# Patient Record
Sex: Female | Born: 1935 | Race: White | Hispanic: No | Smoking: Former smoker
Health system: Southern US, Community
[De-identification: ages and names within clinical notes are randomized; demographics above are authoritative.]

## PROBLEM LIST (undated history)

## (undated) DIAGNOSIS — I495 Sick sinus syndrome: Secondary | ICD-10-CM

## (undated) DIAGNOSIS — K2970 Gastritis, unspecified, without bleeding: Secondary | ICD-10-CM

## (undated) DIAGNOSIS — I2510 Atherosclerotic heart disease of native coronary artery without angina pectoris: Secondary | ICD-10-CM

## (undated) DIAGNOSIS — D649 Anemia, unspecified: Secondary | ICD-10-CM

## (undated) DIAGNOSIS — M1990 Unspecified osteoarthritis, unspecified site: Secondary | ICD-10-CM

## (undated) DIAGNOSIS — I5043 Acute on chronic combined systolic (congestive) and diastolic (congestive) heart failure: Secondary | ICD-10-CM

## (undated) DIAGNOSIS — I639 Cerebral infarction, unspecified: Secondary | ICD-10-CM

## (undated) DIAGNOSIS — I4891 Unspecified atrial fibrillation: Secondary | ICD-10-CM

## (undated) DIAGNOSIS — M4807 Spinal stenosis, lumbosacral region: Secondary | ICD-10-CM

## (undated) DIAGNOSIS — N838 Other noninflammatory disorders of ovary, fallopian tube and broad ligament: Secondary | ICD-10-CM

## (undated) DIAGNOSIS — E538 Deficiency of other specified B group vitamins: Secondary | ICD-10-CM

## (undated) DIAGNOSIS — K279 Peptic ulcer, site unspecified, unspecified as acute or chronic, without hemorrhage or perforation: Secondary | ICD-10-CM

## (undated) DIAGNOSIS — E039 Hypothyroidism, unspecified: Secondary | ICD-10-CM

## (undated) HISTORY — DX: Anemia, unspecified: D64.9

## (undated) HISTORY — PX: ABDOMINAL HYSTERECTOMY: SHX81

## (undated) HISTORY — DX: Cerebral infarction, unspecified: I63.9

## (undated) HISTORY — DX: Unspecified atrial fibrillation: I48.91

## (undated) HISTORY — DX: Other noninflammatory disorders of ovary, fallopian tube and broad ligament: N83.8

## (undated) HISTORY — DX: Gastritis, unspecified, without bleeding: K29.70

## (undated) HISTORY — PX: BACK SURGERY: SHX140

## (undated) HISTORY — DX: Deficiency of other specified B group vitamins: E53.8

## (undated) HISTORY — PX: OTHER SURGICAL HISTORY: SHX169

## (undated) HISTORY — DX: Atherosclerotic heart disease of native coronary artery without angina pectoris: I25.10

## (undated) HISTORY — PX: PACEMAKER INSERTION: SHX728

## (undated) HISTORY — DX: Sick sinus syndrome: I49.5

## (undated) HISTORY — PX: INSERT / REPLACE / REMOVE PACEMAKER: SUR710

## (undated) HISTORY — DX: Spinal stenosis, lumbosacral region: M48.07

## (undated) HISTORY — DX: Hypothyroidism, unspecified: E03.9

## (undated) HISTORY — DX: Peptic ulcer, site unspecified, unspecified as acute or chronic, without hemorrhage or perforation: K27.9

## (undated) HISTORY — DX: Unspecified osteoarthritis, unspecified site: M19.90

---

## 1989-09-06 HISTORY — PX: LUMBAR DISC SURGERY: SHX700

## 1999-04-29 ENCOUNTER — Ambulatory Visit (HOSPITAL_COMMUNITY): Admission: RE | Admit: 1999-04-29 | Discharge: 1999-04-29 | Payer: Self-pay | Admitting: Neurosurgery

## 1999-04-29 ENCOUNTER — Encounter: Payer: Self-pay | Admitting: Neurosurgery

## 1999-06-09 ENCOUNTER — Encounter: Payer: Self-pay | Admitting: Neurosurgery

## 1999-06-09 ENCOUNTER — Ambulatory Visit (HOSPITAL_COMMUNITY): Admission: RE | Admit: 1999-06-09 | Discharge: 1999-06-09 | Payer: Self-pay | Admitting: Neurosurgery

## 1999-06-23 ENCOUNTER — Encounter: Payer: Self-pay | Admitting: Neurosurgery

## 1999-06-23 ENCOUNTER — Ambulatory Visit (HOSPITAL_COMMUNITY): Admission: RE | Admit: 1999-06-23 | Discharge: 1999-06-23 | Payer: Self-pay | Admitting: Neurosurgery

## 1999-11-30 ENCOUNTER — Ambulatory Visit (HOSPITAL_COMMUNITY): Admission: RE | Admit: 1999-11-30 | Discharge: 1999-11-30 | Payer: Self-pay | Admitting: Gastroenterology

## 2000-01-05 ENCOUNTER — Ambulatory Visit (HOSPITAL_COMMUNITY): Admission: RE | Admit: 2000-01-05 | Discharge: 2000-01-05 | Payer: Self-pay | Admitting: Gastroenterology

## 2000-01-05 ENCOUNTER — Encounter (INDEPENDENT_AMBULATORY_CARE_PROVIDER_SITE_OTHER): Payer: Self-pay | Admitting: Specialist

## 2001-02-03 ENCOUNTER — Other Ambulatory Visit: Admission: RE | Admit: 2001-02-03 | Discharge: 2001-02-03 | Payer: Self-pay | Admitting: *Deleted

## 2001-02-06 ENCOUNTER — Other Ambulatory Visit: Admission: RE | Admit: 2001-02-06 | Discharge: 2001-02-06 | Payer: Self-pay | Admitting: *Deleted

## 2001-02-27 ENCOUNTER — Ambulatory Visit (HOSPITAL_COMMUNITY): Admission: RE | Admit: 2001-02-27 | Discharge: 2001-02-27 | Payer: Self-pay | Admitting: *Deleted

## 2001-02-27 ENCOUNTER — Encounter (INDEPENDENT_AMBULATORY_CARE_PROVIDER_SITE_OTHER): Payer: Self-pay

## 2001-05-05 ENCOUNTER — Encounter (HOSPITAL_COMMUNITY): Admission: RE | Admit: 2001-05-05 | Discharge: 2001-06-04 | Payer: Self-pay | Admitting: Oncology

## 2001-05-05 ENCOUNTER — Encounter: Admission: RE | Admit: 2001-05-05 | Discharge: 2001-05-05 | Payer: Self-pay | Admitting: Oncology

## 2001-10-04 ENCOUNTER — Ambulatory Visit (HOSPITAL_COMMUNITY): Admission: RE | Admit: 2001-10-04 | Discharge: 2001-10-04 | Payer: Self-pay | Admitting: Neurosurgery

## 2001-10-04 ENCOUNTER — Encounter: Payer: Self-pay | Admitting: Neurosurgery

## 2001-10-20 ENCOUNTER — Encounter: Admission: RE | Admit: 2001-10-20 | Discharge: 2001-10-20 | Payer: Self-pay | Admitting: Oncology

## 2001-10-31 ENCOUNTER — Encounter: Payer: Self-pay | Admitting: Neurosurgery

## 2001-10-31 ENCOUNTER — Encounter: Admission: RE | Admit: 2001-10-31 | Discharge: 2001-10-31 | Payer: Self-pay | Admitting: Neurosurgery

## 2001-11-14 ENCOUNTER — Encounter: Payer: Self-pay | Admitting: Neurosurgery

## 2001-11-14 ENCOUNTER — Encounter: Admission: RE | Admit: 2001-11-14 | Discharge: 2001-11-14 | Payer: Self-pay | Admitting: Neurosurgery

## 2001-11-30 ENCOUNTER — Encounter: Payer: Self-pay | Admitting: Neurosurgery

## 2001-11-30 ENCOUNTER — Encounter: Admission: RE | Admit: 2001-11-30 | Discharge: 2001-11-30 | Payer: Self-pay | Admitting: Neurosurgery

## 2002-04-10 ENCOUNTER — Other Ambulatory Visit: Admission: RE | Admit: 2002-04-10 | Discharge: 2002-04-10 | Payer: Self-pay | Admitting: *Deleted

## 2002-04-20 ENCOUNTER — Encounter (HOSPITAL_COMMUNITY): Admission: RE | Admit: 2002-04-20 | Discharge: 2002-05-20 | Payer: Self-pay | Admitting: Oncology

## 2002-04-20 ENCOUNTER — Encounter: Admission: RE | Admit: 2002-04-20 | Discharge: 2002-04-20 | Payer: Self-pay | Admitting: Oncology

## 2002-04-24 ENCOUNTER — Encounter (HOSPITAL_COMMUNITY): Payer: Self-pay | Admitting: Oncology

## 2002-06-06 HISTORY — PX: TOTAL ABDOMINAL HYSTERECTOMY W/ BILATERAL SALPINGOOPHORECTOMY: SHX83

## 2002-06-20 ENCOUNTER — Ambulatory Visit (HOSPITAL_COMMUNITY): Admission: RE | Admit: 2002-06-20 | Discharge: 2002-06-20 | Payer: Self-pay | Admitting: Gastroenterology

## 2002-06-20 ENCOUNTER — Encounter (INDEPENDENT_AMBULATORY_CARE_PROVIDER_SITE_OTHER): Payer: Self-pay | Admitting: Specialist

## 2002-06-20 ENCOUNTER — Encounter (INDEPENDENT_AMBULATORY_CARE_PROVIDER_SITE_OTHER): Payer: Self-pay | Admitting: *Deleted

## 2002-06-20 ENCOUNTER — Inpatient Hospital Stay (HOSPITAL_COMMUNITY): Admission: RE | Admit: 2002-06-20 | Discharge: 2002-06-25 | Payer: Self-pay | Admitting: *Deleted

## 2002-07-02 ENCOUNTER — Encounter: Admission: RE | Admit: 2002-07-02 | Discharge: 2002-07-02 | Payer: Self-pay | Admitting: *Deleted

## 2002-10-30 ENCOUNTER — Encounter (HOSPITAL_COMMUNITY): Admission: RE | Admit: 2002-10-30 | Discharge: 2002-11-29 | Payer: Self-pay | Admitting: Oncology

## 2002-10-30 ENCOUNTER — Encounter: Admission: RE | Admit: 2002-10-30 | Discharge: 2002-10-30 | Payer: Self-pay | Admitting: Oncology

## 2003-05-14 ENCOUNTER — Encounter: Admission: RE | Admit: 2003-05-14 | Discharge: 2003-05-14 | Payer: Self-pay | Admitting: Oncology

## 2003-05-14 ENCOUNTER — Encounter (HOSPITAL_COMMUNITY): Admission: RE | Admit: 2003-05-14 | Discharge: 2003-06-06 | Payer: Self-pay | Admitting: Oncology

## 2003-11-11 ENCOUNTER — Encounter (HOSPITAL_COMMUNITY): Admission: RE | Admit: 2003-11-11 | Discharge: 2003-12-11 | Payer: Self-pay | Admitting: Oncology

## 2003-11-11 ENCOUNTER — Encounter: Admission: RE | Admit: 2003-11-11 | Discharge: 2003-11-11 | Payer: Self-pay | Admitting: Oncology

## 2004-07-15 ENCOUNTER — Ambulatory Visit: Payer: Self-pay | Admitting: Cardiology

## 2005-10-29 ENCOUNTER — Inpatient Hospital Stay (HOSPITAL_COMMUNITY): Admission: EM | Admit: 2005-10-29 | Discharge: 2005-11-09 | Payer: Self-pay | Admitting: Emergency Medicine

## 2005-10-29 ENCOUNTER — Ambulatory Visit: Payer: Self-pay | Admitting: Physical Medicine & Rehabilitation

## 2005-10-29 ENCOUNTER — Ambulatory Visit: Payer: Self-pay | Admitting: Cardiology

## 2005-10-31 ENCOUNTER — Ambulatory Visit: Payer: Self-pay | Admitting: Pulmonary Disease

## 2005-11-01 ENCOUNTER — Encounter (INDEPENDENT_AMBULATORY_CARE_PROVIDER_SITE_OTHER): Payer: Self-pay | Admitting: Neurology

## 2005-11-02 ENCOUNTER — Ambulatory Visit: Payer: Self-pay | Admitting: Critical Care Medicine

## 2005-11-04 ENCOUNTER — Encounter: Payer: Self-pay | Admitting: Internal Medicine

## 2005-12-06 ENCOUNTER — Ambulatory Visit: Payer: Self-pay | Admitting: Cardiology

## 2006-02-02 ENCOUNTER — Ambulatory Visit: Payer: Self-pay | Admitting: Internal Medicine

## 2006-08-09 ENCOUNTER — Encounter: Admission: RE | Admit: 2006-08-09 | Discharge: 2006-09-06 | Payer: Self-pay | Admitting: Family Medicine

## 2006-09-07 ENCOUNTER — Encounter: Admission: RE | Admit: 2006-09-07 | Discharge: 2006-10-05 | Payer: Self-pay | Admitting: Family Medicine

## 2006-10-06 ENCOUNTER — Encounter: Admission: RE | Admit: 2006-10-06 | Discharge: 2006-10-20 | Payer: Self-pay | Admitting: Family Medicine

## 2006-10-31 ENCOUNTER — Ambulatory Visit: Payer: Self-pay

## 2006-12-05 ENCOUNTER — Ambulatory Visit: Payer: Self-pay | Admitting: Internal Medicine

## 2006-12-09 ENCOUNTER — Ambulatory Visit: Payer: Self-pay | Admitting: Cardiology

## 2007-01-31 ENCOUNTER — Ambulatory Visit: Payer: Self-pay | Admitting: Internal Medicine

## 2007-03-27 ENCOUNTER — Ambulatory Visit: Payer: Self-pay | Admitting: Internal Medicine

## 2007-03-31 ENCOUNTER — Ambulatory Visit: Payer: Self-pay | Admitting: Cardiology

## 2007-03-31 ENCOUNTER — Inpatient Hospital Stay (HOSPITAL_COMMUNITY): Admission: EM | Admit: 2007-03-31 | Discharge: 2007-04-04 | Payer: Self-pay | Admitting: Emergency Medicine

## 2007-05-22 ENCOUNTER — Ambulatory Visit: Payer: Self-pay | Admitting: Internal Medicine

## 2007-07-17 ENCOUNTER — Ambulatory Visit: Payer: Self-pay | Admitting: Internal Medicine

## 2007-08-18 ENCOUNTER — Encounter: Admission: RE | Admit: 2007-08-18 | Discharge: 2007-08-18 | Payer: Self-pay | Admitting: Neurology

## 2007-09-11 ENCOUNTER — Ambulatory Visit: Payer: Self-pay | Admitting: Internal Medicine

## 2007-10-09 ENCOUNTER — Ambulatory Visit: Payer: Self-pay | Admitting: Internal Medicine

## 2007-12-06 ENCOUNTER — Ambulatory Visit: Payer: Self-pay | Admitting: Cardiology

## 2007-12-07 ENCOUNTER — Ambulatory Visit: Payer: Self-pay | Admitting: Internal Medicine

## 2007-12-07 ENCOUNTER — Observation Stay (HOSPITAL_COMMUNITY): Admission: EM | Admit: 2007-12-07 | Discharge: 2007-12-08 | Payer: Self-pay | Admitting: Emergency Medicine

## 2007-12-12 ENCOUNTER — Ambulatory Visit: Payer: Self-pay

## 2008-01-01 ENCOUNTER — Ambulatory Visit: Payer: Self-pay | Admitting: Cardiology

## 2008-03-11 ENCOUNTER — Ambulatory Visit: Payer: Self-pay | Admitting: Internal Medicine

## 2008-04-08 ENCOUNTER — Ambulatory Visit: Payer: Self-pay

## 2008-07-15 ENCOUNTER — Ambulatory Visit: Payer: Self-pay | Admitting: Internal Medicine

## 2008-10-14 ENCOUNTER — Ambulatory Visit: Payer: Self-pay | Admitting: Internal Medicine

## 2008-11-01 ENCOUNTER — Encounter: Payer: Self-pay | Admitting: Internal Medicine

## 2008-11-05 ENCOUNTER — Ambulatory Visit: Payer: Self-pay | Admitting: Internal Medicine

## 2009-02-08 DIAGNOSIS — I6350 Cerebral infarction due to unspecified occlusion or stenosis of unspecified cerebral artery: Secondary | ICD-10-CM | POA: Insufficient documentation

## 2009-02-08 DIAGNOSIS — E039 Hypothyroidism, unspecified: Secondary | ICD-10-CM | POA: Insufficient documentation

## 2009-02-08 DIAGNOSIS — Z8669 Personal history of other diseases of the nervous system and sense organs: Secondary | ICD-10-CM | POA: Insufficient documentation

## 2009-02-08 DIAGNOSIS — I4891 Unspecified atrial fibrillation: Secondary | ICD-10-CM | POA: Insufficient documentation

## 2009-02-08 DIAGNOSIS — M1990 Unspecified osteoarthritis, unspecified site: Secondary | ICD-10-CM | POA: Insufficient documentation

## 2009-02-08 DIAGNOSIS — I495 Sick sinus syndrome: Secondary | ICD-10-CM | POA: Insufficient documentation

## 2009-02-08 DIAGNOSIS — R55 Syncope and collapse: Secondary | ICD-10-CM

## 2009-02-08 DIAGNOSIS — Z8719 Personal history of other diseases of the digestive system: Secondary | ICD-10-CM

## 2009-02-08 DIAGNOSIS — D518 Other vitamin B12 deficiency anemias: Secondary | ICD-10-CM

## 2009-02-08 DIAGNOSIS — Z87898 Personal history of other specified conditions: Secondary | ICD-10-CM

## 2009-02-08 DIAGNOSIS — I10 Essential (primary) hypertension: Secondary | ICD-10-CM | POA: Insufficient documentation

## 2009-02-08 DIAGNOSIS — G9001 Carotid sinus syncope: Secondary | ICD-10-CM | POA: Insufficient documentation

## 2009-02-08 DIAGNOSIS — I447 Left bundle-branch block, unspecified: Secondary | ICD-10-CM

## 2009-02-14 ENCOUNTER — Ambulatory Visit: Payer: Self-pay | Admitting: Cardiology

## 2009-03-29 ENCOUNTER — Inpatient Hospital Stay (HOSPITAL_COMMUNITY): Admission: EM | Admit: 2009-03-29 | Discharge: 2009-03-31 | Payer: Self-pay | Admitting: Emergency Medicine

## 2009-03-29 ENCOUNTER — Ambulatory Visit: Payer: Self-pay | Admitting: Internal Medicine

## 2009-04-09 ENCOUNTER — Encounter: Payer: Self-pay | Admitting: Internal Medicine

## 2009-04-09 ENCOUNTER — Ambulatory Visit: Payer: Self-pay | Admitting: Cardiology

## 2009-07-09 ENCOUNTER — Ambulatory Visit: Payer: Self-pay | Admitting: Internal Medicine

## 2009-08-19 ENCOUNTER — Ambulatory Visit: Payer: Self-pay | Admitting: Internal Medicine

## 2009-08-19 DIAGNOSIS — R079 Chest pain, unspecified: Secondary | ICD-10-CM

## 2009-08-19 DIAGNOSIS — I4891 Unspecified atrial fibrillation: Secondary | ICD-10-CM

## 2009-09-08 ENCOUNTER — Encounter: Payer: Self-pay | Admitting: Cardiology

## 2009-10-08 ENCOUNTER — Ambulatory Visit: Payer: Self-pay | Admitting: Internal Medicine

## 2010-01-05 ENCOUNTER — Encounter: Payer: Self-pay | Admitting: Cardiology

## 2010-02-12 ENCOUNTER — Encounter: Payer: Self-pay | Admitting: Internal Medicine

## 2010-02-12 ENCOUNTER — Ambulatory Visit: Payer: Self-pay

## 2010-04-02 ENCOUNTER — Ambulatory Visit: Payer: Self-pay | Admitting: Cardiology

## 2010-04-09 ENCOUNTER — Encounter: Payer: Self-pay | Admitting: Internal Medicine

## 2010-04-17 ENCOUNTER — Ambulatory Visit: Payer: Self-pay | Admitting: Internal Medicine

## 2010-05-13 ENCOUNTER — Encounter
Admission: RE | Admit: 2010-05-13 | Discharge: 2010-08-11 | Payer: Self-pay | Source: Home / Self Care | Admitting: Family Medicine

## 2010-07-18 ENCOUNTER — Ambulatory Visit: Payer: Self-pay | Admitting: Internal Medicine

## 2010-09-01 ENCOUNTER — Ambulatory Visit: Payer: Self-pay | Admitting: Internal Medicine

## 2010-10-08 NOTE — Procedures (Signed)
Summary: PACER CK GUIDANT   Allergies: 1)  ! Benadryl   PPM Specifications Following MD:  Sherryl Manges, MD     PPM Vendor:  [facility] [employer]     PPM Model Number:  [id number]     PPM Serial Number:  [id number] DOI:  11/01/2005     PPM Implanting MD:  Sherryl Manges, MD  Lead 1    Location: RA     DOI: 11/01/2005     Model #: [id number]     Serial #: [id number]     Status: active Lead 2    Location: RV     DOI: 11/01/2005     Model #: 5784     Serial #: [id number]     Status: active  Magnet Response Rate:  BOL 100 ERI  85  Indications:  TTM'S with Mednet   PPM Follow Up Battery Voltage:  good V     Battery Est. Longevity:  >5.53yrs     Pacer Dependent:  Yes       PPM Device Measurements Atrium  Impedance: 480 ohms, Threshold: 0.5 V at 0.4 msec Right Ventricle  Amplitude: 6.0 mV, Impedance: 550 ohms, Threshold: 0.5 V at 0.40 msec  Episodes MS Episodes:  3.2K     Percent Mode Switch:  1%     Coumadin:  Yes Ventricular High Rate:  0     Atrial Pacing:  85%     Ventricular Pacing:  70%  Parameters Mode:  DDDR     Lower Rate Limit:  60     Upper Rate Limit:  110 Paced AV Delay:  300     Sensed AV Delay:  260 Next Cardiology Appt Due:  08/06/2010 Tech Comments:  NORMAL DEVICE FUNCTION. NO PWAVES ON CHECK.  NO CHANGES MADE.  ROV IN 6 MTHS W/SK. Vella Kohler  February 12, 2010 3:42 PM

## 2010-10-08 NOTE — Assessment & Plan Note (Signed)
Summary: F1Y/ANAS   Visit Type:  1 yr f/u Primary Provider:  [name] MD  CC:  chest pain...sob...edema/legs/ankles.  History of Present Illness: Ms Thissen returns today for evaluation management of her history of atrial fibrillation, left bundle branch block, carotid sinus sensitivity, history of syncope, atypical chest pain, and permanent pacemaker.  She still gets occasional chest pain shortness of breath particularly at night. She takes several nitroglycerin for that.  She still lives alone. She has not fallen. She denies any bleeding or melena.  She really denies any palpitations or rapid heartbeats. She's had orthopnea PND or edema.  Current Medications (verified): 1)  Metoprolol Tartrate 50 Mg Tabs (Metoprolol Tartrate) .Marland Kitchen.. 1 Tab Two Times A Day 2)  Diltiazem Hcl Er Beads 360 Mg Xr24h-Cap (Diltiazem Hcl Er Beads) .Marland Kitchen.. 1 Cap Once Daily 3)  Coumadin 3 Mg Tabs (Warfarin Sodium) .... As Directed By Cvrr 4)  Levoxyl 100 Mcg Tabs (Levothyroxine Sodium) .Marland Kitchen.. 1 Tab Once Daily 5)  Lasix 20 Mg Tabs (Furosemide) .... As Needed 6)  Antivert 25 Mg Tabs (Meclizine Hcl) .... 1/2 Tab Once Daily 7)  Levoxyl 25 Mcg Tabs (Levothyroxine Sodium) .Marland Kitchen.. 1 Tab On Sat and Sun 8)  Vitamin D 1000 Unit Caps (Cholecalciferol) .Marland Kitchen.. 1 Cap Once Daily 9)  Ultram 50 Mg Tabs (Tramadol Hcl) .Marland Kitchen.. 1 Tab Four Times Daily As Needed 10)  Nitroglycerin 0.4 Mg Subl (Nitroglycerin) .... One Tablet Under Tongue Every 5 Minutes As Needed For Chest Pain---May Repeat Times Three 11)  Promethazine Hcl 25 Mg Tabs (Promethazine Hcl) .... As Needed 12)  Tylenol Extra Strength 500 Mg Tabs (Acetaminophen) .... As Needed 13)  Vitamin B-12 Injection .Marland Kitchen.. 1 Injection Every Month 14)  Isosorbide Dinitrate 10 Mg Tabs (Isosorbide Dinitrate) .Marland Kitchen.. 1 Tab Two Times A Day  Allergies: 1)  ! Benadryl  Past History:  Past Medical History: Last updated: 02/08/2009 PAROXYSMAL ATRIAL FIBRILLATION (ICD-427.31) LEFT BUNDLE BRANCH BLOCK  (ICD-426.3) ANGINA, UNSTABLE (ICD-411.1) HYPERTENSION, UNSPECIFIED (ICD-401.9) PACEMAKER, PERMANENT (ICD-V45.01) CAROTID SINUS SYNDROME (ICD-337.01) CVA (ICD-434.91) HYPOTHYROIDISM (ICD-244.9) SYNCOPE (ICD-780.2) MIGRAINES, HX OF (ICD-V13.8) ANEMIA, B12 DEFICIENCY (ICD-281.1) OSTEOARTHRITIS (ICD-715.90) CATARACT, HX OF (ICD-V12.40) GASTRITIS, HX OF (ICD-V12.79)    Past Surgical History: Last updated: 02/08/2009 Status post Guidant dual lead permanent pacemaker in 2007 secondary to carotid sinus hypersensitivity (pneumothorax after pacemaker).  Colonoscopy with biopsy...06/20/2002 Exploratory laparotomy, lysis of adhesions..06/20/2002 fractional D&C, hysteroscopy withresection of endometrial polyps... 02/27/01       Family History: Last updated: 02/08/2009 Family History of Coronary Artery Disease: Mother deceased at 31, Father deceased at 16 and 1 sister w/heart dz   Social History: Last updated: 02/08/2009 Single  Tobacco Use - Former.  Alcohol Use - no Drug Use - no  Risk Factors: Smoking Status: quit (02/08/2009)  Review of Systems       negative as in history of present illness  Vital Signs:  Patient profile:   77 year old female Height:      67 inches Weight:      140 pounds BMI:     22.01 Pulse rate:   63 / minute Pulse rhythm:   regular BP sitting:   122 / 60  (left arm) Cuff size:   large  Vitals Entered By: Danielle Rankin, CMA (April 02, 2010 4:35 PM)  Physical Exam  General:  elderly, alert oriented Head:  normocephalic and atraumatic Eyes:  glasses, otherwise normal Neck:  Neck supple, no JVD. No masses, thyromegaly or abnormal cervical nodes. Chest Deyanna Mctier:  no deformities  or breast masses noted Lungs:  Clear bilaterally to auscultation and percussion. Heart:  PMI nondisplaced, regular rate and rhythm, widely split S2, no gallop Msk:  decreased ROM.  decreased ROM.   Pulses:  pulses normal in all 4 extremities Extremities:  trace left pedal edema  and trace right pedal edema.  trace left pedal edema.   Neurologic:  Alert and oriented x 3. Skin:  Intact without lesions or rashes. Psych:  Normal affect.   EKG  Procedure date:  04/02/2010  Findings:      AV sequential pacing  PPM Specifications Following MD:  Sherryl Manges, MD     [facility] Vendor:  [facility] [employer]     PPM Model Number:  [id number]     PPM Serial Number:  [id number] DOI:  11/01/2005     PPM Implanting MD:  Sherryl Manges, MD  Lead 1    Location: RA     DOI: 11/01/2005     Model #: 1478     Serial #: [id number]     Status: active Lead 2    Location: RV     DOI: 11/01/2005     Model #: 5784     Serial #: [id number]     Status: active  Magnet Response Rate:  BOL 100 ERI  85  Indications:  TTM'S with Mednet   PPM Follow Up Pacer Dependent:  Yes      Episodes Coumadin:  Yes  Parameters Mode:  DDDR     Lower Rate Limit:  60     Upper Rate Limit:  110 Paced AV Delay:  300     Sensed AV Delay:  260  Impression & Recommendations:  Problem # 1:  PAROXYSMAL ATRIAL FIBRILLATION (ICD-427.31)  Her updated medication list for this problem includes:    Metoprolol Tartrate 50 Mg Tabs (Metoprolol tartrate) .Marland Kitchen... 1 tab two times a day    Coumadin 3 Mg Tabs (Warfarin sodium) .Marland Kitchen... As directed by cvrr  Orders: EKG w/ Interpretation (93000)  Problem # 2:  LEFT BUNDLE BRANCH BLOCK (ICD-426.3) Assessment: Unchanged  Her updated medication list for this problem includes:    Metoprolol Tartrate 50 Mg Tabs (Metoprolol tartrate) .Marland Kitchen... 1 tab two times a day    Diltiazem Hcl Er Beads 360 Mg Xr24h-cap (Diltiazem hcl er beads) .Marland Kitchen... 1 cap once daily    Coumadin 3 Mg Tabs (Warfarin sodium) .Marland Kitchen... As directed by cvrr    Nitroglycerin 0.4 Mg Subl (Nitroglycerin) ..... One tablet under tongue every 5 minutes as needed for chest pain---may repeat times three    Isosorbide Dinitrate 10 Mg Tabs (Isosorbide dinitrate) .Marland Kitchen... 1 tab two times a day  Problem # 3:  CHEST PAIN, ATYPICAL  (ICD-786.59) Assessment: Unchanged  Her updated medication list for this problem includes:    Metoprolol Tartrate 50 Mg Tabs (Metoprolol tartrate) .Marland Kitchen... 1 tab two times a day    Diltiazem Hcl Er Beads 360 Mg Xr24h-cap (Diltiazem hcl er beads) .Marland Kitchen... 1 cap once daily    Coumadin 3 Mg Tabs (Warfarin sodium) .Marland Kitchen... As directed by cvrr    Nitroglycerin 0.4 Mg Subl (Nitroglycerin) ..... One tablet under tongue every 5 minutes as needed for chest pain---may repeat times three    Isosorbide Dinitrate 10 Mg Tabs (Isosorbide dinitrate) .Marland Kitchen... 1 tab two times a day  Problem # 4:  HYPERTENSION, UNSPECIFIED (ICD-401.9) Assessment: Improved  Her updated medication list for this problem includes:    Metoprolol Tartrate 50 Mg Tabs (Metoprolol tartrate) .Marland KitchenMarland KitchenMarland KitchenMarland Kitchen 1  tab two times a day    Diltiazem Hcl Er Beads 360 Mg Xr24h-cap (Diltiazem hcl er beads) .Marland Kitchen... 1 cap once daily    Lasix 20 Mg Tabs (Furosemide) .Marland Kitchen... As needed  Problem # 5:  PACEMAKER, PERMANENT (ICD-V45.01) Assessment: Unchanged  Problem # 6:  CAROTID SINUS SYNDROME (ICD-337.01) Assessment: Improved  Her updated medication list for this problem includes:    Metoprolol Tartrate 50 Mg Tabs (Metoprolol tartrate) .Marland Kitchen... 1 tab two times a day    Diltiazem Hcl Er Beads 360 Mg Xr24h-cap (Diltiazem hcl er beads) .Marland Kitchen... 1 cap once daily    Coumadin 3 Mg Tabs (Warfarin sodium) .Marland Kitchen... As directed by cvrr    Nitroglycerin 0.4 Mg Subl (Nitroglycerin) ..... One tablet under tongue every 5 minutes as needed for chest pain---may repeat times three    Isosorbide Dinitrate 10 Mg Tabs (Isosorbide dinitrate) .Marland Kitchen... 1 tab two times a day  Problem # 7:  CVA (ICD-434.91)  Her updated medication list for this problem includes:    Coumadin 3 Mg Tabs (Warfarin sodium) .Marland Kitchen... As directed by cvrr  Problem # 8:  COUMADIN THERAPY (ICD-V58.61) Assessment: Unchanged  Patient Instructions: 1)  Your physician recommends that you schedule a follow-up appointment in: YEAR   WITH  DR Alexzander Dolinger 2)  Your physician recommends that you continue on your current medications as directed. Please refer to the Current Medication list given to you today.

## 2010-10-08 NOTE — Cardiovascular Report (Signed)
Summary: Office Visit   Office Visit   Imported By: Roderic Ovens 09/02/2010 08:59:11  _____________________________________________________________________  External Attachment:    Type:   Image     Comment:   External Document

## 2010-10-08 NOTE — Assessment & Plan Note (Signed)
Summary: DEVICE/SAF   Visit Type:  PPM-[employer] Primary [name]:  [name] MD  CC:  sob and leg swelling.  History of Present Illness:   [name] returns in followup for paroxysmal atrial fibrillation, syncope secondary to carotid sinus hypersensitivity previously implanted pacemaker.  She really denies any palpitations or rapid heartbeats. she has had no edema or chest pain  Problems Prior to Update: 1)  Coumadin Therapy  (ICD-V58.61) 2)  Postural Lightheadedness  (ICD-780.4) 3)  Paroxysmal Atrial Fibrillation  (ICD-427.31) 4)  Left Bundle Branch Block  (ICD-426.3) 5)  Chest Pain, Atypical  (ICD-786.59) 6)  Hypertension, Unspecified  (ICD-401.9) 7)  Pacemaker, Permanent  (ICD-V45.01) 8)  Carotid Sinus Syndrome  (ICD-337.01) 9)  Cva  (ICD-434.91) 10)  Hypothyroidism  (ICD-244.9) 11)  Syncope  (ICD-780.2) 12)  Migraines, Hx of  (ICD-V13.8) 13)  Anemia, B12 Deficiency  (ICD-281.1) 14)  Osteoarthritis  (ICD-715.90) 15)  Cataract, Hx of  (ICD-V12.40) 16)  Gastritis, Hx of  (ICD-V12.79)  Current Medications (verified): 1)  Metoprolol Tartrate 50 Mg Tabs (Metoprolol Tartrate) .Marland Kitchen.. 1 Tab Two Times A Day 2)  Diltiazem Hcl Er Beads 360 Mg Xr24h-Cap (Diltiazem Hcl Er Beads) .Marland Kitchen.. 1 Cap Once Daily 3)  Coumadin 3 Mg Tabs (Warfarin Sodium) .... As Directed By Cvrr 4)  Levoxyl 100 Mcg Tabs (Levothyroxine Sodium) .Marland Kitchen.. 1 Tab Once Daily 5)  Lasix 20 Mg Tabs (Furosemide) .... As Needed 6)  Antivert 25 Mg Tabs (Meclizine Hcl) .... 1/2 Tab Once Daily 7)  Levoxyl 25 Mcg Tabs (Levothyroxine Sodium) .Marland Kitchen.. 1 Tab On Sat and Sun 8)  Vitamin D 1000 Unit Tabs (Cholecalciferol) .... Two Times A Day 9)  Ultram 50 Mg Tabs (Tramadol Hcl) .Marland Kitchen.. 1 Tab Four Times Daily As Needed 10)  Nitroglycerin 0.4 Mg Subl (Nitroglycerin) .... One Tablet Under Tongue Every 5 Minutes As Needed For Chest Pain---May Repeat Times Three 11)  Promethazine Hcl 25 Mg Tabs (Promethazine Hcl) .... As Needed 12)   Tylenol Extra Strength 500 Mg Tabs (Acetaminophen) .... As Needed 13)  Vitamin B-12 Injection .Marland Kitchen.. 1 Injection Every Month 14)  Isosorbide Dinitrate 10 Mg Tabs (Isosorbide Dinitrate) .Marland Kitchen.. 1 Tab Two Times A Day  Allergies (verified): 1)  ! Benadryl  Past History:  Past Medical History: Last updated: 02/08/2009 PAROXYSMAL ATRIAL FIBRILLATION (ICD-427.31) LEFT BUNDLE BRANCH BLOCK (ICD-426.3) ANGINA, UNSTABLE (ICD-411.1) HYPERTENSION, UNSPECIFIED (ICD-401.9) PACEMAKER, PERMANENT (ICD-V45.01) CAROTID SINUS SYNDROME (ICD-337.01) CVA (ICD-434.91) HYPOTHYROIDISM (ICD-244.9) SYNCOPE (ICD-780.2) MIGRAINES, HX OF (ICD-V13.8) ANEMIA, B12 DEFICIENCY (ICD-281.1) OSTEOARTHRITIS (ICD-715.90) CATARACT, HX OF (ICD-V12.40) GASTRITIS, HX OF (ICD-V12.79)    Past Surgical History: Last updated: 02/08/2009 Status post Guidant dual lead permanent pacemaker in 2007 secondary to carotid sinus hypersensitivity (pneumothorax after pacemaker).  Colonoscopy with biopsy...06/20/2002 Exploratory laparotomy, lysis of adhesions..06/20/2002 fractional D&C, hysteroscopy withresection of endometrial polyps... 02/27/01       Family History: Last updated: 02/08/2009 Family History of Coronary Artery Disease: Mother deceased at 29, Father deceased at 73 and 1 sister w/heart dz   Social History: Last updated: 02/08/2009 Single  Tobacco Use - Former.  Alcohol Use - no Drug Use - no  Risk Factors: Smoking Status: quit (02/08/2009)  Vital Signs:  Patient profile:   78 year old female Height:      67 inches Weight:      138.50 pounds BMI:     21.77 Pulse rate:   64 / minute BP sitting:   118 / 72  (right arm) Cuff size:   regular  Vitals Entered By:  Caralee Ates CMA (September 01, 2010 3:23 PM)  Physical Exam  General:  The patient was alert and oriented in no acute distress. HEENT Normal.  Neck veins were flat, carotids were brisk.  Lungs were clear.  Heart sounds were regular without murmurs or  gallops.  Abdomen was soft with active bowel sounds. There is no clubbing cyanosis or edema. Skin Warm and dry    PPM Specifications Following MD:  Sherryl Manges, MD     [facility] Vendor:  [facility] [employer]     PPM Model Number:  [id number]     PPM Serial Number:  [id number] DOI:  11/01/2005     PPM Implanting MD:  Sherryl Manges, MD  Lead 1    Location: RA     DOI: 11/01/2005     Model #: 0981     Serial #: [id number]     Status: active Lead 2    Location: RV     DOI: 11/01/2005     Model #: 2130     Serial #: [id number]     Status: active  Magnet Response Rate:  BOL 100 ERI  85  Indications:  TTM'S with Mednet   PPM Follow Up Remote Check?  No Battery Voltage:  good  V     Battery Est. Longevity:  5 years     Pacer Dependent:  Yes       PPM Device Measurements Atrium  Amplitude: 1.8 mV, Impedance: 460 ohms, Threshold: 0.5 V at 0.4 msec Right Ventricle  Amplitude: 4.4 mV, Impedance: 560 ohms, Threshold: 0.8 V at 0.4 msec  Episodes MS Episodes:  0     Coumadin:  Yes Atrial Pacing:  81%     Ventricular Pacing:  65%  Parameters Mode:  DDDR     Lower Rate Limit:  60     Upper Rate Limit:  110 Paced AV Delay:  300     Sensed AV Delay:  260 Next Cardiology Appt Due:  02/05/2011 Tech Comments:  No parameter changes.   Device function normal.  27.2K/ PVC's and 19.8K/PAC's.  TTM's with Mednet.  ROV 6 months clinic. Altha Harm, LPN  September 01, 2010 3:38 PM    Impression & Recommendations:  Problem # 1:  PAROXYSMAL ATRIAL FIBRILLATION (ICD-427.31)  noen detected on her pacer Her updandeted medication list for this problem includes:    Metoprolol Tartrate 50 Mg Tabs (Metoprolol tartrate) .Marland Kitchen... 1 tab two times a day    Coumadin 3 Mg Tabs (Warfarin sodium) .Marland Kitchen... As directed by cvrr  Her updated medication list for this problem includes:    Metoprolol Tartrate 50 Mg Tabs (Metoprolol tartrate) .Marland Kitchen... 1 tab two times a day    Coumadin 3 Mg Tabs (Warfarin sodium) .Marland Kitchen... As directed by  cvrr  Problem # 2:  PACEMAKER, PERMANENT (ICD-V45.01) Device parameters and data were reviewed and no changes were made  Problem # 3:  CAROTID SINUS SYNDROME (ICD-337.01)  intermittent atrial and ventriuclar pacing  Her updated medication list for this problem includes:    Metoprolol Tartrate 50 Mg Tabs (Metoprolol tartrate) .Marland Kitchen... 1 tab two times a day    Diltiazem Hcl Er Beads 360 Mg Xr24h-cap (Diltiazem hcl er beads) .Marland Kitchen... 1 cap once daily    Coumadin 3 Mg Tabs (Warfarin sodium) .Marland Kitchen... As directed by cvrr    Nitroglycerin 0.4 Mg Subl (Nitroglycerin) ..... One tablet under tongue every 5 minutes as needed for chest pain---may repeat times three    Isosorbide Dinitrate 10  Mg Tabs (Isosorbide dinitrate) .Marland Kitchen... 1 tab two times a day  Patient Instructions: 1)  Your physician recommends that you continue on your current medications as directed. Please refer to the Current Medication list given to you today. 2)  Your physician wants you to follow-up in: 6 months with pacer clinic in [place] and 12 months with Dr. Johney Frame in [place].   You will receive a reminder letter in the mail two months in advance. If you don't receive a letter, please call our office to schedule the follow-up appointment.

## 2010-10-08 NOTE — Cardiovascular Report (Signed)
Summary: Office Visit   Office Visit   Imported By: Roderic Ovens 02/27/2010 09:50:25  _____________________________________________________________________  External Attachment:    Type:   Image     Comment:   External Document

## 2010-10-08 NOTE — Cardiovascular Report (Signed)
Summary: TTM   TTM   Imported By: Roderic Ovens 10/27/2009 15:52:40  _____________________________________________________________________  External Attachment:    Type:   Image     Comment:   External Document

## 2010-10-19 ENCOUNTER — Encounter: Payer: Self-pay | Admitting: Internal Medicine

## 2010-10-19 DIAGNOSIS — I495 Sick sinus syndrome: Secondary | ICD-10-CM

## 2010-11-12 NOTE — Cardiovascular Report (Signed)
Summary: TTM   TTM   Imported By: Roderic Ovens 11/04/2010 14:41:17  _____________________________________________________________________  External Attachment:    Type:   Image     Comment:   External Document

## 2010-12-13 LAB — GLUCOSE, CAPILLARY: Glucose-Capillary: 128 mg/dL — ABNORMAL HIGH (ref 70–99)

## 2010-12-13 LAB — CARDIAC PANEL(CRET KIN+CKTOT+MB+TROPI)
CK, MB: 5.8 ng/mL — ABNORMAL HIGH (ref 0.3–4.0)
CK, MB: 6.1 ng/mL — ABNORMAL HIGH (ref 0.3–4.0)
Relative Index: 3.3 — ABNORMAL HIGH (ref 0.0–2.5)
Relative Index: 4.1 — ABNORMAL HIGH (ref 0.0–2.5)
Total CK: 143 U/L (ref 7–177)
Total CK: 185 U/L — ABNORMAL HIGH (ref 7–177)
Troponin I: 0.02 ng/mL (ref 0.00–0.06)
Troponin I: 0.02 ng/mL (ref 0.00–0.06)

## 2010-12-13 LAB — CK TOTAL AND CKMB (NOT AT ARMC)
CK, MB: 7.1 ng/mL — ABNORMAL HIGH (ref 0.3–4.0)
Relative Index: 3 — ABNORMAL HIGH (ref 0.0–2.5)
Total CK: 240 U/L — ABNORMAL HIGH (ref 7–177)

## 2010-12-13 LAB — PROTIME-INR
INR: 1.9 — ABNORMAL HIGH (ref 0.00–1.49)
INR: 2 — ABNORMAL HIGH (ref 0.00–1.49)
Prothrombin Time: 22.9 seconds — ABNORMAL HIGH (ref 11.6–15.2)
Prothrombin Time: 23.5 seconds — ABNORMAL HIGH (ref 11.6–15.2)

## 2010-12-13 LAB — DIFFERENTIAL
Basophils Relative: 0 % (ref 0–1)
Eosinophils Absolute: 0 10*3/uL (ref 0.0–0.7)
Eosinophils Relative: 0 % (ref 0–5)
Lymphocytes Relative: 9 % — ABNORMAL LOW (ref 12–46)
Lymphs Abs: 0.9 10*3/uL (ref 0.7–4.0)
Neutrophils Relative %: 87 % — ABNORMAL HIGH (ref 43–77)

## 2010-12-13 LAB — BASIC METABOLIC PANEL
Chloride: 104 mEq/L (ref 96–112)
GFR calc Af Amer: >60 mL/min (ref >60)
Potassium: 3.8 mEq/L (ref 3.5–5.1)
Sodium: 139 mEq/L (ref 135–145)

## 2010-12-13 LAB — POCT CARDIAC MARKERS
CKMB, poc: 2.6 ng/mL (ref 1.0–8.0)
CKMB, poc: 3.2 ng/mL (ref 1.0–8.0)
Troponin i, poc: <0.05 ng/mL (ref 0.00–0.09)
Troponin i, poc: <0.05 ng/mL (ref 0.00–0.09)

## 2010-12-13 LAB — CBC
HCT: 32.5 % — ABNORMAL LOW (ref 36.0–46.0)
Hemoglobin: 10.7 g/dL — ABNORMAL LOW (ref 12.0–15.0)
MCHC: 32.8 g/dL (ref 30.0–36.0)
MCV: 82.5 fL (ref 78.0–100.0)
RBC: 3.94 MIL/uL (ref 3.87–5.11)
RDW: 14.8 % (ref 11.5–15.5)
WBC: 9.6 10*3/uL (ref 4.0–10.5)

## 2010-12-13 LAB — TSH: TSH: 1.075 u[IU]/mL (ref 0.350–4.500)

## 2010-12-13 LAB — APTT: aPTT: 35 seconds (ref 24–37)

## 2011-01-18 ENCOUNTER — Encounter: Payer: Self-pay | Admitting: Internal Medicine

## 2011-01-18 DIAGNOSIS — I495 Sick sinus syndrome: Secondary | ICD-10-CM

## 2011-01-19 NOTE — H&P (Signed)
NAME:  [name], [name] NO.:  [id number]   MEDICAL RECORD NO.:  [id number]          PATIENT TYPE:  EMS   LOCATION:  [facility]                         FACILITY:  [facility]   PHYSICIAN:  Bevelyn Buckles. Bensimhon, MDDATE OF BIRTH:  10/04/1917   DATE OF ADMISSION:  12/07/2007  DATE OF DISCHARGE:                              HISTORY & PHYSICAL   PRIMARY CARE PHYSICIAN:  Dr. Christell Constant of [facility].   PRIMARY CARDIOLOGIST:  Jesse Sans. Wall, MD, [facility]   CHIEF COMPLAINT:  Chest pain.   HISTORY OF PRESENT ILLNESS:  [name] is an 77 year old female with a  history of nonobstructive coronary artery disease by remote cath and  pacemaker secondary to carotid sinus hypersensitivity.  Today she was in  her usual state of health but had onset of an elevated heart rate and  palpitations with chest pain.  The chest pain was in the upper part of  her chest and radiated up into her neck.  Her heart rate was  approximately 118 when she took it by her home blood pressure machine.  Her symptoms began about 12:30.  The pain reached an 8/10.  She had some  shortness of breath but no nausea, vomiting or diaphoresis.  She had  sublingual nitroglycerin at home and took 2 of them with little relief.  She went to see her primary MD and there received a third sublingual  nitroglycerin.  Her symptoms began easing off by about 2 p.m. and had  resolved by 3 p.m.  The symptoms started while she was at lunch.  She  has had similar symptoms before and gets these 4-5 times a year.  They  are not associated with exertion but she does not exert herself much.  She also gets episodic shortness of breath and occasional palpitations  but not necessarily all at the same time.  She cannot say how long the  palpitations will last or how often they occur.  The shortness of breath  occasionally occurs at rest but she also has consistent dyspnea on  exertion with exertion such as walking from one end of  the house to the  other.  Currently she is resting comfortably.   PAST MEDICAL HISTORY:  1. Status post cardiac catheterization in 1992 with LAD 30-50% and a      septal perforator 70%.  2. Status post echocardiogram in July of 2008 showing an EF of 50-55%.  3. CVA in 2007.  4. Status post Guidant dual lead permanent pacemaker in 2007 secondary      to carotid sinus hypersensitivity (pneumothorax after pacemaker).  5. Hypertension.  6. Family history of coronary artery disease.  7. Hypothyroidism.  8. History of underlying left bundle branch block.  9. History of gastritis.  10.History of cataracts.  11.Osteoarthritis.  12.Anemia with B12 deficiency.  13.History of migraine.  14.History of lumbar disk problems.  15.History of paroxysmal atrial fibrillation.   ALLERGIES:  She is allergic or intolerant to ACTONEL, CODEINE, TYLENOL,  ULTRACET.   CURRENT MEDICATIONS:  1. Tramadol 50 mg b.i.d. p.r.n.  2. Diltiazem  ER 120 mg b.i.d.  3. Levoxyl 100 mcg daily with an additional 25 mcg on Saturday and      Sunday.  4. Metoprolol 50 mg b.i.d.  5. Coumadin 3 mg 2 tablets daily.  6. Antivert 25 mg daily and t.i.d. p.r.n.  7. Lasix 40 mg a day.  8. Reglan 10 mg p.r.n.   SOCIAL HISTORY:  She lives in [place] alone.  She is a housewife.  She  quit tobacco greater than 40 years ago and has never abused alcohol or  drugs.   FAMILY HISTORY:  Her mother died at age 55 and her father died at age  23, both with coronary artery disease.  She also has 1 sister with heart  disease.   REVIEW OF SYSTEMS:  The chest pain, shortness of breath, palpitations  are described above.  She has had no recent illnesses, no fevers,  chills, coughing or wheezing.  She has noted some general malaise  recently and also noted some dizziness which is helped with the  Antivert.  She has chronic arthralgias and pain in her hips and back.  She denies recent reflux symptoms, hematemesis, hemoptysis or melena.   Full 14 point review of systems is otherwise negative.   PHYSICAL EXAM:  VITAL SIGNS:  Temperature is 97.6, blood pressure  174/86, pulse 60, respiratory rate 17, O2 saturation 99% on room air.  GENERAL:  She is a well-developed elderly white female in no acute  distress.  HEENT:  Is normal.  NECK:  There is no lymphadenopathy, thyromegaly, bruit or JVD noted.  CARDIOVASCULAR:  Her heart is moderately irregular in rate and rhythm  with an S1-S2 but no significant murmur, rub or gallop is noted.  Distal  pulses are intact in all 4 extremities and no femoral bruits are  appreciated.  LUNGS:  Are essentially clear to auscultation bilaterally.  SKIN:  No rashes or lesions are noted.  ABDOMEN:  Soft and nontender with active bowel sounds.  EXTREMITIES:  There is no cyanosis, clubbing or edema noted.  MUSCULOSKELETAL:  There is no joint deformity, effusions and no spine or  CVA tenderness is noted.  NEUROLOGICAL:  She is alert and oriented.  Cranial nerves II-XII grossly  intact.   Chest x-ray is borderline cardiac enlargement but no acute disease.   EKG is AV pacing, rate 72.   Laboratory values are pending but point of care markers are negative x2.   IMPRESSION:  Chest pain:  [name] has had atypical chest pain in the  setting of palpitations.  It is doubtful that this is ischemic but we  will admit her overnight and rule out MI (myocardial infarction).  Her  pacemaker can be interrogated in a.m. to see if she is having recurrent  atrial fibrillation or other significant arrhythmias.  If her cardiac  enzymes are negative and interrogating her pacer does not show any  significant arrhythmias she can possibly be discharged home and have an  outpatient Myoview and follow up with Dr. Daleen Squibb.  She will be continued  on her home medications and a TSH as well as other labs are pending at  the time of dictation.      Theodore Demark, PA-C      Bevelyn Buckles. Bensimhon, MD  Electronically  Signed    RB/MEDQ  D:  12/07/2007  T:  12/08/2007  Job:  [id number]   cc:   Ernestina Penna, M.D.

## 2011-01-19 NOTE — Assessment & Plan Note (Signed)
[facility]                            CARDIOLOGY OFFICE NOTE   [name], [name]                       MRN:          [id number]  DATE:12/06/2007                            DOB:          11-Mar-1918    [name] returns today for management of the following issues:  1. History of atrial fibrillation.  2. 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  3. A history of TIA and stroke associated with carotid artery      hypersensitivity with syncope and severe bradycardia, complete      heart block.  4. A history of dual-chamber pacemaker by Dr. Berton Mount.   She had her pacemaker checked in February.  She is doing well.  She is  frustrated she cannot get out and plant her garden and do activities.  She comes with a neighbor and her friend, who says she is doing really  well.  She is still very sharp mentally.  She saw Dr. Vernon Prey  recently, who obtained blood work.  This all looked within normal limits  except a borderline low B12.  She is now getting B12 injections.  Her  thyroid panel was normal, electrolytes were normal, and CBC was normal.   She looks remarkably younger than her stated age.  She is 89.  She has  no other complaints.  She has had no syncope or presyncope.   Her meds are Levoxyl 25 mg every Saturday and Sunday, metoprolol 50 mg  p.o. b.i.d, Dilacor XR 230 mg a day, Coumadin as directed, Lasix 20 mg a  day, Antivert 25 mg a day.   EXAM:  Her blood pressure today is 161/70, pulse 59 and she is regular.  EKG without a magnet shows AV sequential pacing and with the magnet it  shows AV sequential pacing up to a rate of 100 beats a minute.  HEENT:  Normocephalic, atraumatic.  PERRLA.  Extraocular movements are  intact.  Facial symmetry is normal.  Her affect is less depressed than last time.  Carotids are full.  She has a soft right carotid bruit.  Thyroid is not  enlarged.   Trachea is midline.  LUNGS:  Clear.  HEART:  Reveals a nondisplaced PMI.  Normal S1 and S2.  The abdomen is soft.  There is no edema.  Pulses are present.  NEURO:  Exam is intact.   [name] is doing well from our standpoint.  I reinforced that she is  getting around pretty well, looks much younger than stated age, has not  had any more complications.  She seems to have a lot of family and  friends' support.  I will see her back in a year.     Thomas C. Daleen Squibb, MD, [facility]  Electronically Signed    TCW/MedQ  DD: 12/06/2007  DT: 12/06/2007  Job #: [id number]   cc:   Ernestina Penna, M.D.

## 2011-01-19 NOTE — Discharge Summary (Signed)
NAME:  [name], [name] NO.:  [id number]   MEDICAL RECORD NO.:  [id number]          PATIENT TYPE:  OBV   LOCATION:  3735                         FACILITY:  [facility]   PHYSICIAN:  Thomas C. Wall, MD, FACCDATE OF BIRTH:  09/11/1917   DATE OF ADMISSION:  12/07/2007  DATE OF DISCHARGE:  12/08/2007                               DISCHARGE SUMMARY   PRIMARY CARE PHYSICIAN:  Ernestina Penna, MD   DISCHARGE DIAGNOSIS:  Chest pain.   SECONDARY DIAGNOSES:  1. History of nonobstructive coronary artery disease by cardiac      catheterization in 1992.  2. History of normal left ventricular function by echocardiogram, July      2008.  3. History of cerebrovascular accident in 2007.  4. Hypertension.  5. Hypothyroidism.  6. History of left bundle-branch block.  7. History of bradycardia, status post Guidant dual-chamber permanent      pacemaker.  8. History of gastritis.  9. Cataracts.  10.B12 deficiency anemia.  11.Osteoarthritis.  12.History of migraines.  13.History of paroxysmal atrial fibrillation.   ALLERGIES:  1. ACTONEL.  2. CODEINE.  3. TYLENOL.  4. ULTRACET.   PROCEDURES:  Pacemaker interrogation.   HISTORY OF PRESENT ILLNESS:  An 77 year old Caucasian female with  history of nonobstructive CAD and paroxysmal atrial fibrillation.  She  was in her usual state of health on December 07, 2007, when she noted tachy  palpitations and chest discomfort with radiation to her neck associated  with dyspnea and unrelieved by nitroglycerin.  She was seen by her  primary care [name], treated with additional sublingual nitroglycerin  with complete resolution of her symptoms approximately 2-1/2 hours after  they started.  She was subsequently sent to the [facility] ED for  further evaluation.  Upon arrival in the ED, she was AV paced on ECG and  point-of-care markers were negative x2.  She was admitted for further  evaluation and rule out.   HOSPITAL COURSE:  The patient  ruled out for MI and had no additional  palpitations, although she had had one episode of chest discomfort and  that was brief in nature.  Her INR was noted be subtherapeutic at 1.5.  She will have her pacemaker interrogated this afternoon and at this time  that is pending.  We do plan, however, to discharge her home today with  an increase in her diltiazem dose from 120 mg to 240 mg daily.  We have  also arranged for an outpatient adenosine Myoview on December 12, 2007, at  7:45 a.m.  [name] will be discharged home today in good condition.   DISCHARGE LABS:  Sodium 139, potassium 4.0, chloride 102, CO2 30, BUN  17, creatinine 0.81, glucose 98, and INR 1.5, total bilirubin 0.6,  alkaline phosphatase 50, AST 24, ALT 14, and albumin 3.3.  Cardiac  markers negative x2.  Total cholesterol 159, triglycerides 73, HDL 48,  LDL 96, calcium 8.9, and TSH 2.783.   DISPOSITION:  The patient is being discharged home today in good  condition.   FOLLOWUP PLANS AND APPOINTMENTS:  She has an  adenosine Myoview scheduled  for December 12, 2007, at 7:45 a.m.  She will follow up with Dr. Valera Castle  on January 01, 2008, at 2 p.m.  She is to follow up with Dr. Christell Constant for INR  check next week.   DISCHARGE MEDICATIONS:  1. Diltiazem XR 240 mg daily.  2. Levoxyl 100 mcg daily, 125 mcg Saturday and Sunday.  3. Metoprolol 50 mg b.i.d.  4. Coumadin 3 mg daily.  5. Lasix 40 mg daily.  6. Antivert 25 mg daily and t.i.d. p.r.n.  7. Reglan 10 mg as previously prescribed.  8. Tramadol as previously prescribed.  9. Nitroglycerin 0.4 mg sublingual p.r.n. chest pain.   OUTSTANDING LAB STUDIES:  Pacemaker is currently pending.   DURATION OF DISCHARGE/ENCOUNTER:  45 minutes including physician time.      [name], ANP      Karen Sans. Daleen Squibb, MD, [facility]  Electronically Signed    CB/MEDQ  D:  12/08/2007  T:  12/09/2007  Job:  [id number]   cc:   Ernestina Penna, M.D.

## 2011-01-19 NOTE — Discharge Summary (Signed)
NAME:  [name], [name] NO.:  [id number]   MEDICAL RECORD NO.:  [id number]          PATIENT TYPE:  INP   LOCATION:  3706                         FACILITY:  [facility]   PHYSICIAN:  Hillis Range, MD       DATE OF BIRTH:  April 22, 1918   DATE OF ADMISSION:  03/29/2009  DATE OF DISCHARGE:  03/31/2009                               DISCHARGE SUMMARY   This patient has allergies to ACTONEL, CODEINE, TYLENOL, ULTRACET.   Time for this dictation, exam, and explanation to the patient greater  than 45 minutes.   FINAL DIAGNOSES:  1. Admitted with chest pain/concomitant chest pain with heart racing      at a birthday party.      a.     The patient has chronic stable angina.      b.     Possible inciting diagnosis of gastroesophageal reflux       disease.      c.     Chest pain possibly exacerbated by elevated blood pressure,       which was noted on admission.      d.     Interrogation of pacemaker shows that the patient was most       probably experiencing atrial fibrillation with rapid ventricular       rate simultaneously with her complaint of chest pain.      e.     Metoprolol 50 mg twice daily at discharge.      f.     Cardizem 360 mg daily at discharge.      g.     Isosorbide dinitrate added this admission 10 mg twice daily.   SECONDARY DIAGNOSES:  1. Guidant [employer]ignia dual-chamber pacemaker implanted February 2007 for      carotid sinus hypersensitivity syndrome.  2. The patient had a low-risk Myoview study in 2009 with ejection      fraction of 70%.  3. History of cerebrovascular accident in 2007.  4. Paroxysmal atrial fibrillation on chronic Coumadin therapy.  5. Hypertension.  6. History of gastritis.  7. Treated hypothyroidism.   No procedures this admission, but new medication changes with Cardizem  going from 240-360 mg daily and the addition of isosorbide dinitrate 10  mg twice daily.   The patient did have interrogation of her pacemaker, which showed  increased burden of atrial fibrillation with rapid rates.  Several  changes were made to the pacemaker by request of Dr. Johney Frame.  The atrial  sensitivity was decreased from 0.75-0.5 mV, minute ventilation from 1-2,  mode switch trigger from 170-150.  This represents an increase in the  rate response for this device.   BRIEF HISTORY:  Ms. Sieg is an 77 year old female.  She has a history  of paroxysmal atrial fibrillation.  She is status post permanent  pacemaker.  She presents with chest pain.   The patient states her pain started on March 29, 2009, while she was at a  [employer], it began suddenly.  She was not exerting herself and the  pain is described as feeling very much  like her usual angina pain.  She  took nitroglycerin, but had little relief.  The pain lasted for about 1  hour.  It did resolve spontaneously.  It was not associated with  diaphoresis, nausea, syncope, or palpitation.   The patient does relate chest pain, which occasionally exhibits at night  when she lies down.  It is associated with some diaphoresis.  She states  that these symptoms are generally mild.  Otherwise, she has been in her  usual state of health.  She denies previous syncope, palpitation,  nausea, increased lower extremity edema, paroxysmal nocturnal dyspnea,  or orthopnea.   IMPRESSION:  This is chest pain, which is possibly atypical, possibly  noncardiac, possibly secondary to gastroesophageal reflux.  There is no  current evidence of ongoing ischemia.  Electrocardiogram does not show  any ST or T-wave changes.  The patient is pacing at a rate of 60.  We  will admit the patient, rule out myocardial infarction with cardiac  enzymes, and monitor on telemetry.  There is some possibility that the  patient has had an episode of paroxysmal atrial fibrillation, which has  exacerbated her chronic stable angina.   HOSPITAL COURSE:  The patient presents with an episode of chest pain,  which lasted  about 1 hour, which would seem to be consistent with her  chronic stable angina.  She relates that she had concomitant heart  racing at the child's party she was attending.  Her troponin-I studies  are all negative.  Her TSH is 1.075.  INR on admission was 1.8.  She was  seen on hospital day #3 by Dr. Johney Frame, he was able to view the results  of interrogation of her pacemaker, which showed atrial fibrillation,  rapid ventricular rate, indeed coinciding with the patient's symptoms of  chest pain.  Increased her diltiazem from 240-360 mg daily.  He added  isosorbide dinitrate 10 mg twice daily.  As a result of the  interrogation, he had reprogrammed the pacemaker to increase the rate  response and to switch the trigger from 170-150.  The patient has been  chest pain free here.  She is achieving oxygen saturations at 97% on  room air.  Her blood pressure has been moderately elevated.  On the day  of discharge, it was 165/74, but this will be somewhat amended by an  increased dose of diltiazem.  The patient is asked to take her blood  pressure and heart rate especially the days following her discharge  here.  She is asked to call Dr. Vern Claude office if she notes heart rate  going below 50 beats per minute, although at the present time she has  pacer capability.  She is also asked to call Dr. Vern Claude office if her  blood pressure goes below 95 systolic.  Ms. Polka discharged on the  following new medications:  1. Diltiazem 360 mg daily.  2. Isosorbide dinitrate 10 mg 1 tablet in the morning, 1 tablet in the      evening.   She continues the following medications:  1. Levoxyl 100 mcg 1 tablet daily except 100 mcg plus 25 mcg Saturday      and Sunday.  2. Meclizine 12.5 mg daily.  3. Metoprolol 50 mg 1 tablet in the morning, 1 tablet in the evening.  4. Coumadin 3 mg tablets, 2 tablets daily except 2 and one-half      tablets on Monday.  5. Nitroglycerin 0.4 mg 1 tablet under the tongue every  5  minutes as      needed for chest pain.  6. Tramadol 50 mg 3-4 daily as needed for pain.  7. Furosemide 20 mg daily as needed.  8. Promethazine 25 mg daily as needed.  9. Vitamin D3 1000 International Units daily at night.  10.Tylenol Extra Strength 2 tablets as needed.  11.Vitamin B12 injections monthly.   She follows up [facility], [address] [place]  to see Dr. Daleen Squibb Wednesday April 09, 2009, at 4:30.   LAB STUDIES ON THIS ADMISSION:  Her admission complete blood count,  white cells 9.6, hemoglobin 10.7, hematocrit 32.5, and platelets of 220.  Serum electrolytes this admission, sodium 139, potassium 3.8, chloride  104, carbonate 26, BUN is 22, creatinine is 0.9, glucose 181.  Protime  on the day of discharge is 22.9, INR is 1.9.  Troponin-I studies are  0.02 x3.  TSH this admission is 1.075.      Maple Mirza, [place]      Hillis Range, MD  Electronically Signed    GM/MEDQ  D:  03/31/2009  T:  04/01/2009  Job:  [id number]   cc:   Ernestina Penna, M.D.  Thomas C. Wall, MD, [facility]

## 2011-01-19 NOTE — H&P (Signed)
NAME:  [name], [name] NO.:  [id number]   MEDICAL RECORD NO.:  [id number]          PATIENT TYPE:  INP   LOCATION:  1442                         FACILITY:  [facility]   PHYSICIAN:  Beckey Rutter, MD  DATE OF BIRTH:  03/07/1918   DATE OF ADMISSION:  03/31/2007  DATE OF DISCHARGE:                              HISTORY & PHYSICAL   CHIEF COMPLAINT:  Weakness and chest pain.   HISTORY OF PRESENT ILLNESS:  This is an 77 year old pleasant Caucasian  female with past medical history significant for hypertension,  hypothyroidism and atrial fibrillation on Coumadin.  She presented today  because of generalized weakness that started in the morning and followed  by chest pain.  The patient has been experiencing this chest pain for  the last year or so, and this time she took nitroglycerin sublingual  nitroglycerin.  She felt a slight improvement, but still she felt weak.  So, the ambulance was called and she was brought into the hospital.   Upon arrival to the hospital this chest pain elevated, but she was still  feeling kind of weak in the emergency department.  She stated the chest  pain is mainly to the anterior of the chest; no significant radiation  and associated with nausea.  The patient had a history last year of  similar chest pain, when she was found to have arrhythmia -- that  required pacemaker placement.  She followed up in February with [facility]  Cardiology (Dr. Valera Castle).  Since then she has not seen a doctor for  her pacemaker and she said it was done by phone afterwards.   PAST MEDICAL HISTORY:  1. Atrial fibrillation.  2. Hypertension.  3. Hypothyroidism.  4. Status post pacemaker.   SOCIAL HISTORY:  She lives at home alone.  Denied ethanol abuse or  tobacco abuse.   FAMILY HISTORY:  Nonsignificant.   DRUG ALLERGIES:  Allergy to CODEINE is not listed.   MEDICATIONS:  1. Antivert 25 mg as needed.  2. Coumadin 3 mg once daily.  3. Diltiazem HCL  120 mg once daily.  4. Lasix 40 mg daily.  5. Levoxyl 100 mcg daily.  6. Metoprolol tartrate 50 mg twice a day.  7. Tramadol/acetaminophen 50 mg twice a day p.r.n.   REVIEW OF SYSTEMS:  As per HPI.   PHYSICAL EXAMINATION:  VITAL SIGNS:  Temperature 97.8, blood pressure  142/68, pulse 60, respiratory rate 15.  GENERAL:  She was lying flat in bed in no acute distress.  No dyspnea.  HEENT:  Head:  Atraumatic, normocephalic.  Eyes:  Wearing glasses.  Pupils reactive to light.  Mouth:  Moist.  NECK:  Supple.  No JVD.  Precordium:  Distal heart sounds fairly  audible.  LUNGS:  Bilateral fair air entry.  ABDOMEN:  Soft, nontender.  EXTREMITIES:  Lower Extremities: She has slight edema bilaterally.  NEUROLOGICAL:  Patient was given history.  She is alert, oriented x3.  Moving all her extremities spontaneously.   LABS AND X-RAYS:  Chest X-Ray:  Primary report reading, no acute  disease.  Postsurgical change.  White blood count 7.2, hemoglobin 12.2,  hematocrit 36.8, platelet 266.  D-dimer 0.25.  INR 1.7.  Sodium 137,  potassium 4.5, chloride 101, carbon dioxide 28, glucose 115, BUN 16,  creatinine 0.86.  Myoglobin 75.5, CK-MB 1.7, troponin less than 0.05.  BNP 245.  Urine is yellow, clear; specific gravity 10.12, leukocyte  esterase negative, urine nitrate is negative.  EKG showing paced rhythm  with atrial fibrillation.   ASSESSMENT AND PLAN:  This is an 77 year old Caucasian female with  history of arrhythmia/atrial fibrillation status post pacer.  She  presented today with weakness and chest pain.  Likely the patient had  one of her anginal symptoms, though she is not documented she has  coronary artery disease in the past.  Weakness is not neurological in  nature, as she just cannot describe anything other than the state of  being weak.   The patient will be admitted for further assessment and management.  The  patient will be ruled out for acute coronary syndromes/MI with serial   cardiac enzymes.  The patient will also have EKG repeated every 8 hours.  We will obtain two-D echocardiogram during this hospitalization.  Depending on the overall outcome, we might consider cardiology  consultation or obtain the old records for previous pacemaker  interrogation.  For her hypothyroidism, we will continue Synthroid.  For  the dizziness, will continue Antivert p.r.n..  For her atrial  fibrillation, we will continue on Coumadin as per pharmacy dosing.  The  heart rate control will be achieved by Cardizem and most of metoprolol,  as before admission.  DVT prophylaxis will be the Coumadin, which will  be continued during this hospitalization for her unknown atrial  fibrillation.   The patient seems to worry about staying home alone, and she was  considering to be with a group.  I discussed with her the discharge plan  after she became stable, to assisted living facility and she wanted to  think it over.  Also, I noticed the patient seems somehow depressed, and  she seems in grievement.  Somehow, because she was complaining about her  home being empty now, not as it used to be in the past.  I suspect the  patient has some element of depression.  I would follow through with  diet, and probably consider SSRI for diet.  After the patient is stable  in regards with her chest pain and the rule out process of acute  coronary syndromes.      Beckey Rutter, MD  Electronically Signed     EME/MEDQ  D:  03/31/2007  T:  04/01/2007  Job:  [id number]

## 2011-01-19 NOTE — Discharge Summary (Signed)
NAME:  [name], [name] NO.:  [id number]   MEDICAL RECORD NO.:  [id number]          PATIENT TYPE:  INP   LOCATION:  1442                         FACILITY:  [facility]   PHYSICIAN:  Beckey Rutter, MD  DATE OF BIRTH:  1918/04/15   DATE OF ADMISSION:  03/31/2007  DATE OF DISCHARGE:  04/04/2007                               DISCHARGE SUMMARY   CHIEF COMPLAINT:  On presentation, weakness and chest pain.Marland Kitchen   HISTORY OF PRESENT ILLNESS:  An 77 year old Caucasian female with past  medical history significant for hypothyroidism and atrial fibrillation  presented with generalized weakness and chest pain.   HOSPITAL COURSE:  #1 - During hospitalization the patient was ruled out  for acute coronary disease by serial cardiac enzymes and EKGs.  She was  also seen and evaluated by Cardiology.  Her primary cardiologist is Dr.  Daleen Squibb.  The recommendation is to discharge the patient to follow up with  Dr. Daleen Squibb on diltiazem, metoprolol and warfarin as before admission.  #2 - The patient seems depressed and tearful.  During this  hospitalization, the patient was started on Paxil with a small dose to  be followed up and increased incrementally as needed after towards 3  weeks if no affect was achieved by this dose.  The patient's condition  was discussed with her and she will be discharged today with Paxil.  #3 - The patient seems to worry about her living alone, but now she made  her mind to just be discharged to home and her family will check on her  frequently.  She denied assisted-living facility discharge.   DISCHARGE DIAGNOSES:  1. Chest pain secondary to the arterial fibrillation.  2. Atrial fibrillation on chronic Coumadin.  3. Depression.  4. Hypertension.  5. Hypothyroidism.  6. Status post pacemaker.   DISCHARGE MEDICATIONS:  1. Antivert 25 mg p.o. p.r.n.  2. Coumadin 3 mg daily.  3. Diltiazem HCl 120 mg daily.  4. Lasix 4 mg daily.  5. Synthroid 100 mcg daily.  6.  Metoprolol tartrate 50 mg twice a day.  7. Tramadol/acetaminophen 50 mg twice daily p.r.n.  8. Paxil 10 mg p.o. daily.   DISCHARGE/PLAN:  The patient was discharged to her home to follow up  with her primary physician as discussed with her and the family.      Beckey Rutter, MD  Electronically Signed     EME/MEDQ  D:  04/04/2007  T:  04/05/2007  Job:  [id number]

## 2011-01-19 NOTE — Assessment & Plan Note (Signed)
[facility]                            CARDIOLOGY OFFICE NOTE   [name], [name]                       MRN:          [id number]  DATE:01/01/2008                            DOB:          11/09/1917    [name] returns today for close followup of recent hospitalization for  unstable angina.   She has had very little if any discomfort.  She sometimes will take one  nitroglycerin which helps.   Her stress Myoview was low-risk, showing an EF of 69% with no ischemia.   CURRENT MEDICATIONS:  1. Lasix 20 mg a day.  2. Coumadin.  3. Dilacor XR 240 a day.  4. Metoprolol tartrate 50 b.i.d.   PHYSICAL EXAMINATION:  VITAL SIGNS:  Blood pressure was 168/68, her  pulse 60 and regular.  HEENT:  Unchanged.  NECK:  Carotids were equal bilateral without bruits.  No JVD.  LUNGS:  Clear.  HEART:  Regular rate and rhythm.  No gallop.  ABDOMEN:  Soft, good bowel sounds.  No midline bruit.  EXTREMITIES:  No cyanosis, clubbing, or edema.  Pulses are intact.  NEUROLOGIC:  Intact.   I am delighted that [name] is doing well.  We reinforced the fact  that she needs the sit down or lie down if she takes a nitroglycerin.  Hopefully, she is low risk for an acute coronary syndrome.  We will plan  on seeing her back in a year.     Thomas C. Daleen Squibb, MD, [facility]  Electronically Signed    TCW/MedQ  DD: 01/01/2008  DT: 01/01/2008  Job #: [id number]   cc:   Ernestina Penna, M.D.

## 2011-01-19 NOTE — Consult Note (Signed)
NAME:  [name], [name] NO.:  [id number]   MEDICAL RECORD NO.:  [id number]          PATIENT TYPE:  INP   LOCATION:  1442                         FACILITY:  [facility]   PHYSICIAN:  Gerrit Friends. Dietrich Pates, MD, FACCDATE OF BIRTH:  11/10/1917   DATE OF CONSULTATION:  04/03/2007  DATE OF DISCHARGE:                                 CONSULTATION   REFERRING PHYSICIAN:  Dr. Corky Downs.   PRIMARY CARE PHYSICIAN:  Dr. Christell Constant.   PRIMARY CARDIOLOGIST:  Dr. Daleen Squibb.   PRIMARY ELECTROPHYSIOLOGIST:  Dr. Graciela Husbands.   HISTORY OF PRESENT ILLNESS:  An 77 year old woman with a history of mild  coronary disease, carotid sinus sensitivity and paroxysmal atrial  fibrillation; consultation requested 3 days after admission for near  syncope.  [name] has been in gradually declining health due to  advanced age and multiple chronic symptoms without clear underlying  etiology.  She was last evaluated in our office 4 months ago at which  time her cardiac status appeared stable.  She does have occasional  episodes of lightheadedness in the past with arrest.  She has never had  loss of consciousness.  She has been maintained on anticoagulants for  paroxysmal atrial fibrillation.  She underwent coronary angiography 15  years ago at which time she had insignificant scattered coronary  disease.  Her last stress test was approximately 5 years ago and was  presumably negative.  At the present time, recollection of her admission  event revolves around flushing, weakness and diaphoresis.  She does not  now report chest discomfort, although this was noted at that time of  admission.  Her symptoms resolved within approximately an hour.  She has  felt as if she is back to baseline since then.  Today, she had some  nausea, which apparently delayed discharge.   PAST MEDICAL HISTORY:  Is notable for peptic ulcer disease with a remote  Billroth II procedure.  She has had an appendectomy, exploratory  laparotomy, D&C and  back surgery.  She has osteoarthritis and continuing  chronic back pain.  She has hypothyroidism for which she received  adequate replacement therapy.  There is a history of migraine and anemia  with B12 deficiency.  She suffered a CVA in the past that was diagnosed  in 2007.  She has hypertension that has been well-controlled.  Lipid  status is uncertain.   SOCIAL HISTORY:  Lives alone in [place] and continues to drive; widowed;  did not work outside the home.  A 50 pack-year history of cigarette  smoking that was discontinued in 2001.   MEDICATIONS AT HOME:  Included:  1. Metoprolol 50 mg b.i.d.  2. Sublingual nitroglycerin p.r.n.  3. Warfarin 6 mg daily.  4. Levothyroxine 0.1 mg daily 5 days per week and 0.025 mg 2 days per      week.  5. Antivert 25 mg p.r.n.  6. Diltiazem 120 mg daily.  7. Furosemide 40 mg p.r.n. swelling.   ADVERSE REACTIONS OR ALLERGIES:  TO CODEINE, ACTONEL, TYLENOL AND  ULTRACET ARE DESCRIBED.   FAMILY HISTORY:  Mother died at age 54  with coronary disease; father  died at age 62 also with coronary disease.  Three siblings have coronary  disease.   REVIEW OF SYSTEMS:  Is notable for chronic class 2 dyspnea on exertion,  occasional palpitations, intermittent chest pain that is responsive to  sublingual nitroglycerin, arthralgias, intermittent nausea, occasional  emesis and diarrhea.  All other systems reviewed and are negative.   On exam, fairly spry-appearing, older woman in no acute distress.  The  temperature is 97.9, heart rate 60 and regular, respirations 18, blood  pressure 180/75, O2 saturation 99% on room air.  HEENT:  EOMs full; normal lids and conjunctiva; normal oral mucosa.  NECK:  No jugular venous distension; normal carotid upstrokes without  bruits.  ENDOCRINE:  No thyromegaly.  HEMATOPOIETIC:  No adenopathy.  LUNGS:  Clear.  CARDIAC:  Normal first and second heart sounds; fourth heart sound  present.  ABDOMEN:  Soft and nontender;  normal bowel sounds; no masses; no  organomegaly.  EXTREMITIES:  1/2+ ankle edema; distal pulses intact.  NEUROMUSCULAR:  Symmetric strength and tone; normal cranial nerves.   EKG:  AV sequential pacing.   Telemetry system is not currently functioning.  The nurse reports atrial  fibrillation with a paced rhythm.  There is no documentation of this in  the chart.   Echocardiogram:  Technically difficult study; no significant valvular  abnormalities identified; LV systolic function appears normal.   IMPRESSION:  [name] presented to the hospital with an episode of near-  syncope consistent with a neurocardiogenic mechanism.  We discussed the  behavioral approach to management of these symptoms.  There is no  evidence for acute coronary event.  Due to her advanced age, further  testing for cardiac disease is not necessarily worthwhile.  She should  be treated with a statin drug or other lipid lowering agent, but I will  leave that undertaking to her primary cardiologist, Dr. Daleen Squibb.  It is  highly unlikely that pacemaker dysfunction accounts for her symptoms.  She need not have her pacemaker assessed before discharge.  Lying and  standing blood pressures will be measured.  She is receiving furosemide  daily, but actually uses this on a p.r.n. basis and probably does not  require it except for peripheral edema.  Please schedule a follow-up  office visit with Dr. Daleen Squibb at which time her pacemaker can be  interrogated.      Gerrit Friends. Dietrich Pates, MD, [facility]  Electronically Signed     RMR/MEDQ  D:  04/03/2007  T:  04/04/2007  Job:  [id number]

## 2011-01-19 NOTE — Assessment & Plan Note (Signed)
[facility]                         ELECTROPHYSIOLOGY OFFICE NOTE   [name], [name]                       MRN:          [id number]  DATE:11/05/2008                            DOB:          1917/12/17    [name] is seen in followup after a 3-year hiatus.  She has a history  of carotid sinus hypersensitivity and syncope and status post pacemaker.  She has had no intercurrent episodes.   Her medications include:  1. Metoprolol tartrate 50 b.i.d.  2. Dilacor 240.  3. Coumadin.  4. Levoxyl.  5. Lasix.  6. Antivert.   On examination, her blood pressure is 154/67 with a pulse of 59.  Weight  was 146 which is stable.  Her lungs were clear.  Heart sounds were  regular.  Extremities were without edema.   Interrogation of her Guidant pulse generator demonstrates a P-wave of  2.4 with impedance of 440, threshold 0.4 at 0.4.  The R-wave was 4.7  with impedance of 490, threshold 0.6 at 0.4.  She is 67% atrial paced.  Her heart rate excursion was increased, and her AV delay was lengthened  to promote intrinsic conduction from the 30%.   We will plan to see her again in 1 year's time.      Duke Salvia, MD, [facility]  Electronically Signed    SCK/MedQ  DD: 11/05/2008  DT: 11/06/2008  Job #: [id number]

## 2011-01-19 NOTE — H&P (Signed)
NAME:  [name], REASOR NO.:  [id number]   MEDICAL RECORD NO.:  [id number]          PATIENT TYPE:  INP   LOCATION:  3310                         FACILITY:  [facility]   PHYSICIAN:  Wendi Snipes, MD DATE OF BIRTH:  March 04, 1918   DATE OF ADMISSION:  03/29/2009  DATE OF DISCHARGE:                              HISTORY & PHYSICAL   PRIMARY CARDIOLOGIST:  Maisie Fus C. Wall, MD, [facility].   PRIMARY DOCTOR:  Ernestina Penna, M.D.   CHIEF COMPLAINT:  Chest pain.   HISTORY OF PRESENT ILLNESS:  This is an 77 year old white female with a  history of paroxysmal atrial fibrillation, status post pacemaker,  here  with chest pain.  She states that her pain started today while she was  at a child's party and began suddenly.  She was not doing any physical  exertion and this pain is described as feeling like her usual chest  pain.  She took nitroglycerin with little relief and the pain lasted for  approximately 1 hour.  It resolved spontaneously and was not associated  with diaphoresis, nausea, syncope, or palpitations.  She does endorse  chest pain occasionally at night when she lays down and it is associated  with some diaphoresis.  She states these symptoms are generally mild.  Otherwise, she had been in her usual state of health.  She also denies  previous syncope, palpitations, nausea, increased lower extremity edema,  paroxysmal nocturnal dyspnea, or  orthopnea.   PAST MEDICAL HISTORY:  1. History of carotid artery hypersensitivity and syncope, status post      Guidant DDD pacemaker.  2. Remote cardiac cath and recent low-risk Myoview in 2009 showing an      ejection fraction of 70%.  3. History of stroke in 2007.  4. History of hypertension.  5. History of gastritis.  6. History of paroxysmal atrial fibrillation on Coumadin therapy.   ALLERGIES:  ACTONEL, CODEINE, TYLENOL AND ULTRACET.   MEDICATIONS ON ADMISSION:  1. Lasix 20 mg daily.  2. Dilacor XR 240 mg daily.  3.  Metoprolol 50 mg twice daily.  4. Coumadin 3 mg as directed.   SOCIAL HISTORY:  She lives in [place], she lives alone.  She is a  previous housewife.  She quit smoking many years ago.   FAMILY HISTORY:  No premature coronary artery disease, though her  parents did have coronary disease at advanced age.   REVIEW OF SYSTEMS:  All 14 systems were reviewed and were negative  except for those mentioned in detail in the HPI.   PHYSICAL EXAMINATION:  Her blood pressure is 149/56, her respiratory  rate is 16.  Her pulse is 68 beats per minute and paced.  Her  temperature is afebrile.  Her saturations are 99% in room air.  GENERAL:  She is an 77 year old elderly white female, appearing stated  age, in no acute distress.  HEENT: Moist mucous membranes.  Pupils equal, round, react to light  accommodation.  Anicteric sclerae.  NECK:  No jugular distention.  No thyromegaly.  CARDIOVASCULAR: Regular rate and rhythm.  No murmurs, rubs  or gallops.  LUNGS: Clear to auscultation bilaterally.  ABDOMEN:  Nontender, nondistended.  Positive bowel sounds.  No masses.  EXTREMITIES:  Trace lower extremity edema on the left to the shin.  She  has a rash that is currently wrapped and medicated on her right lower  extremity.  NEUROLOGIC:  Alert and oriented x3.  Cranial nerves II-XII grossly  intact.  No focal neurologic deficit.  SKIN:  Rash as mentioned above.  Warm, dry and intact.   RADIOLOGY:  Chest x-ray showed possible atelectasis or bilateral lower  lobe pneumonia.  EKG showed a rate of 60 that is paced without ST or T-  wave changes suggesting ischemia.   LABORATORY REVIEW:  White blood cell count 9.6, hematocrit 32.5,  platelets 220, potassium is 3.8.  Her BUN is 22, creatinine 0.9,  platelets 181.  Troponin is less than 0.05.  CK-MB is 3.2.  INR is 1.8.   ASSESSMENT AND PLAN:  This is an 77 year old white female with a history  of atrial fibrillation though no history of coronary artery  disease,  here with chest pain.  1. Chest pain.  It is atypical in nature and very likely noncardiac      and possibly gastroesophageal reflux disease. There is currently no      objective evidence of recent or ongoing ischemia.  Will admit to      rule out myocardial infarction with cardiac enzymes and monitor      carefully.  She currently has an INR that is barely subtherapeutic      and will currently hold further anticoagulation in context of this      and her advanced age.  Will continue her current medications.      Continue Coumadin.  2. Atrial fibrillation on Coumadin therapy.  Her Coumadin is      subtherapeutic and will recheck an INR in the morning and possibly      increase her Coumadin dose to maintain an INR of approximately 2 to      3.      Wendi Snipes, MD  Electronically Signed     BHH/MEDQ  D:  03/29/2009  T:  03/30/2009  Job:  [id number]

## 2011-01-19 NOTE — Discharge Summary (Signed)
NAME:  [name], [name] NO.:  [id number]   MEDICAL RECORD NO.:  [id number]          PATIENT TYPE:  OBV   LOCATION:  3735                         FACILITY:  [facility]   PHYSICIAN:  Thomas C. Wall, MD, FACCDATE OF BIRTH:  09-Jan-1918   DATE OF ADMISSION:  12/07/2007  DATE OF DISCHARGE:  12/08/2007                               DISCHARGE SUMMARY   ADDENDUM:  The patient's dual chamber pacemaker was interrogated today by the  [employer] and the patient was noted to have  AFib/flutter event beginning at least on 1:09 p.m. on the day of  admission.  Due to device memory limitation it is unclear if this is  occurring earlier than that.  Since October 09, 2007, she has had 96  modes, which is for total of 1.8 hours for the last but the longest  appearing 20.9 minutes.  We will continue Ms. Rust on current does of  diltiazem as previously prescribed (240 mg daily) as well as Coumadin  therapy.  Followup as previously prescribed.      [name], ANP      Karen Sans. Daleen Squibb, MD, [facility]  Electronically Signed    CB/MEDQ  D:  12/08/2007  T:  12/09/2007  Job:  [id number]

## 2011-01-21 ENCOUNTER — Encounter: Payer: Self-pay | Admitting: Family Medicine

## 2011-01-22 ENCOUNTER — Telehealth: Payer: Self-pay | Admitting: Internal Medicine

## 2011-01-22 NOTE — Procedures (Signed)
[place]  Patient:    KEMYA, SHED                       MRN: [id number] Proc. Date: 11/30/99 Adm. Date:  [id number] Attending:  Nelda Marseille CC:         Petra Kuba, M.D.             Monica Becton, M.D.                           Procedure Report  PROCEDURE PERFORMED:  Esophagogastroduodenoscopy.  ENDOSCOPIST:  Petra Kuba, M.D.  INDICATIONS:  Iron deficiency anemia, probably due to her previous gastric surgery but on aspirin and nonsteroidals, although guaiac negative on her physical exam. Consent was signed after risks, benefits, methods, and options were thoroughly discussed.  MEDICATIONS USED:  Demerol 30 mg, Versed 3.5 mg.  DESCRIPTION OF PROCEDURE:  Video endoscope was inserted by direct vision, esophagus was normal.  Possibly there was a tiny hiatal hernia.  The scope was inserted into the stomach which was a moderate sized gastric remnant and an obvious Billroth  anastomosis.  There was some very mild bile reflux gastritis.  The anastomosis as intact.  No obvious ulcers were seen.  The scope was advanced down one limb which was fairly long until it began to loop.  No small bowel abnormality was seen nor any heme.  The scope was slowly withdrawn back to the stomach and then was advanced a very short ways down the other limb which was seen to be a blind pouch and a ery short limb.  The scope was withdrawn back to the stomach and retroflexed which confirmed a tiny hiatal hernia, ruled out any cardia and fundic lesions.  Scope was straightened.  Evaluation of the pouch confirmed the above bile reflux gastritis, ruled out any other etiology.  Air was suctioned, scope slowly withdrawn. Again, a good look at the esophagus on slow withdrawal was normal.  Scope was removed. he patient tolerated the procedure well.  There was no obvious immediate complication.  ENDOSCOPIC DIAGNOSIS: 1. Tiny hiatal hernia. 2.  Minimal bile reflux gastritis. 3. Status post Billroth 2 anastomosis with no obvious small bowel abnormalities.  PLAN:  Barium enema versus colonoscopy next.  She is borderline to be transfused and if she drops any further and does not come up on iron, will need to transfuse her.  Again will make sure she is not using any aspirin or nonsteroidals and will ask her to see me back in two to four weeks in the [place] office to recheck CBC and set up lower bowel testing.  Ask her to call me sooner p.r.n. DD:  11/30/99 TD:  11/30/99 Job: [id number]

## 2011-01-22 NOTE — Consult Note (Signed)
NAME:  [name], [name] NO.:  [id number]   MEDICAL RECORD NO.:  [id number]          PATIENT TYPE:  INP   LOCATION:  1823                         FACILITY:  [facility]   PHYSICIAN:  Thomas C. Wall, M.D.   DATE OF BIRTH:  02-20-1918   DATE OF CONSULTATION:  10/29/2005  DATE OF DISCHARGE:                                   CONSULTATION   REASON FOR CONSULTATION:  We were asked by Dr. Billie Ruddy to evaluate  [name], a delightful 77 year old right handed white widowed female  with carotid massage induced asystole.   HISTORY OF PRESENT ILLNESS:  She presented today with new onset visual loss.  This happened about a.m. She went to [facility] and  was sent here by ambulance. Dr. Sharene Skeans was asked to assess her. She has  had a previous stroke, which was likely silent, based on seeing this on CT  scan today. During Dr. Darl Householder evaluation, he softly touched the right  carotid, which had a soft bruit. She went into an 8 second asystolic arrest.  There were no complications. He repeated it on the other side and she did  the same thing. She has no history of syncope, per say.   PAST MEDICAL HISTORY:  1.  Nonobstructive coronary artery disease by catheterization in 1992. Her      last perfusion study was in 2003 with an ejection fraction of 58%. She      has a history of left bundle branch block pattern. She had a Holter per      Dr. Antoine Poche without pauses but ectopic atrial tachycardia and      ventricular bigeminy.  2.  Hypertension.  3.  Hypothyroidism.  4.  History of anemia.  5.  Osteoarthritis.  6.  Migraines.  7.  Gastritis.   ALLERGIES:  She is intolerant to CODEINE.   ADMISSION MEDICATIONS:  1.  Metoprolol 25 mg daily.  2.  Diltiazem 120 daily.  3.  Aciphex 20 mg daily.  4.  Lasix 20 mg p.r.n.  5.  Levoxyl 0.1 mg Monday through Friday and 0.025 Saturday and Sunday.  6.  Meclizine 25 mg p.r.n.   PAST SURGICAL HISTORY:  She had  a hemilaminectomy in 1990's, abdominal  hysterectomy in 2003, appendectomy, Billroth II gastrectomy in 1978,  cataract surgery.   SOCIAL HISTORY:  She is widowed. She does not smoke but has in the past. She  does not drink.   FAMILY HISTORY:  Noncontributory and negative.   REVIEW OF SYSTEMS:  She has occasional palpitations. There is a question of  some pre-syncope but no blatant syncope.   PHYSICAL EXAMINATION:  GENERAL:  She is very pleasant.  VITAL SIGNS:  Blood pressure 156/63, heart rate 52 and sinus brady with a  left bundle. Temperature 98.4. Respiratory rate 12. She got 100% sat on 2  liters.  HEENT:  Normocephalic and atraumatic. She has arcus senilis. Pupils are  equal, round, and reactive to light and accommodation.  NECK:  She has a soft carotid bruit on the right. Carotids were not  palpated. There is no thyromegaly. Trachea midline.  LUNGS:  Clear.  HEART:  Soft S1 and S2 without gallop.  ABDOMEN:  Soft with good bowel sounds. There is no organomegaly.  EXTREMITIES:  Without clubbing, cyanosis, or edema. Pulses were present.   LABORATORY DATA:  EKG shows sinus brady with a left bundle, rate of 52 beats  per minute.   Hemoglobin 11.6, platelets 240,000. Creatinine 1.1.   ASSESSMENT:  1.  Cardio inhibitory carotid artery sensitivity. The patient has a history      of pre-syncope but no blatant syncope.  2.  Cerebrovascular accident via left posterior cerebral artery distribution      non-hemorrhagic infarction, likely cardio-embolic source. She also has a      remote left posterior cerebral artery infarct.  3.  Hypertension.  4.  Hyperlipidemia.  5.  Vascular disease.   PLAN:  The patient is going to be admitted to neurology. I have had a long  talk with patient and her family. She clearly needs a pacemaker before she  has a syncopal event and hurts herself or someone else. She is, or was  driving prior to admission. Dr. Sharene Skeans has her on heparin. She will  need a  2-D echocardiogram.      Maisie Fus C. Wall, M.D.  Electronically Signed     TCW/MEDQ  D:  10/29/2005  T:  10/31/2005  Job:  [id number]   cc:   Ernestina Penna, M.D.  Fax: [phone]   Deanna Artis. Sharene Skeans, M.D.  Fax: [phone]   Rollene Rotunda, M.D.  [address]  [address]  [place]  [place] [address]

## 2011-01-22 NOTE — Op Note (Signed)
NAME:  [name], [name] NO.:  [id number]   MEDICAL RECORD NO.:  [id number]                   PATIENT TYPE:  AMB   LOCATION:  ENDO                                 [facility]ACILITY[facility]  [facility]   PHYSICIAN:  Petra Kuba, M.D.                 DATE OF BIRTH:  1918-02-14   DATE OF PROCEDURE:  DATE OF DISCHARGE:                                 OPERATIVE REPORT   PROCEDURE:  Colonoscopy with biopsy.   INDICATIONS FOR PROCEDURE:  A patient with a history of colon polyps with  Dr. Carey Bullocks about to undergo probable ovarian cancer surgery requesting  preop colonoscopy to rule out any significant lesions.   Consent was signed after risks, benefits, methods, and options were  thoroughly discussed in the past.   MEDICINES USED:  Demerol 80, Versed 9.   DESCRIPTION OF PROCEDURE:  Rectal inspection was pertinent for external  hemorrhoids. Digital exam was negative. The pediatric video adjustable  colonoscope was inserted and with lots of difficulty due to a very poor prep  and lots of sigmoid diverticula. Once through this area and withdrawing the  loop with abdominal pressure, we were able to advance to the level of the  ileocecal valve. To advance to the cecal pole required rolling her first on  her right side, back on her back and then finally on her left side with  abdominal pressure. The cecum was identified by the appendiceal orifice and  the ileocecal valve. The prep was poor throughout. Lots of washing and  suctioning was done to rule out large obstructing lesions with small to  possibly even medium size lesions that could have been missed. On insertion,  a left sided diverticula was seen only. Along the ileocecal valve, a  questionable polyp was seen and was cold biopsied x2. The scope was then  further withdrawn. As mentioned above, no large obstruction lesions were  seen. She had difficulty holding air in the sigmoid on withdrawal as was the  left side of the  colon it was difficult to fill with air and with the poor  prep lesions could have been missed. There was also some wall edema but no  other abnormalities and no signs of bleeding or large obstructing masses.  Once back in the rectum, the scope was retroflexed pertinent for some  internal hemorrhoids, small. The scope was straightened and readvanced a  short ways up the left side of the colon, air and water were suctioned,  scope removed. The patient tolerated the procedure adequately. There was on  adequate or immediate complication.   ENDOSCOPIC DIAGNOSIS:  1. Small internal and external hemorrhoids.  2. Significant left sided diverticula.  3. Questionable small IC valve polyp.  4. No large or obstructing lesion seen; however, with the poor prep small to     medium lesions could have been easily missed.  5. Otherwise within normal  limits to the cecum.   PLAN:  Surgical options per Dr. Carey Bullocks. Be happy to see back p.r.n. and  rescreen p.r.n. I will be out of town the rest of this week so if a question  or problems come up from a GI standpoint, please call one of my partners.                                               Petra Kuba, M.D.    MEM/MEDQ  D:  06/20/2002  T:  06/20/2002  Job:  [id number]   cc:   Pershing Cox, M.D.  [address]  [address]  [place]  [place] [address]  Fax: [phone]   Ernestina Penna, M.D.  [address] [place]  [place] [address]  Fax: [phone]   Ladona Horns. Neijstrom, M.D.  [address]  [place]  [place] [address]  Fax: [phone]

## 2011-01-22 NOTE — H&P (Signed)
NAME:  [name], [name] NO.:  [id number]   MEDICAL RECORD NO.:  [id number]          PATIENT TYPE:  EMS   LOCATION:  MAJO                         FACILITY:  [facility]   PHYSICIAN:  Deanna Artis. Hickling, M.D.DATE OF BIRTH:  28-Jun-1918   DATE OF ADMISSION:  10/29/2005  DATE OF DISCHARGE:                                HISTORY & PHYSICAL   CHIEF COMPLAINT:  Right eye initial loss.   HISTORY OF PRESENT ILLNESS:  This 77 year old, right-handed Caucasian woman  who awakened this morning well and had the onset of visual loss in her  right eye this morning at about 8 a.m.  She was seen at [facility] and noted to have dizziness and nausea and blurred vision in  her right eye.   The patient was taken by ambulance to [facility] with a  diagnosis of TIA, made by the emergency room physician.  I was asked to  assess her.   PAST MEDICAL HISTORY:  1.  Prior stroke (likely silent).  This was seen on the CT scan of the brain      today in the left occipital region.  2.  Atherosclerotic cardiovascular disease with angina, followed by Dr.      Rollene Rotunda.  3.  Anemia.  4.  Vitamin B12 deficiency.  5.  Spinal stenosis.  6.  Cardiac arrhythmia, of unknown etiology and type.  7.  Gastritis with prior gastric ulcers.  8.  Osteoarthritis.  9.  Hypothyroidism.   PAST SURGICAL HISTORY:  1.  Bilateral iridectomies.  2.  Lumbar laminectomy.  3.  Appendectomy.  4.  Hysterectomy.  5.  Cholecystectomy.   CURRENT MEDICATIONS:  1.  Levoxyl 100 mcg daily except for Saturdays and Sundays 25 mcg.  2.  Metoprolol 50 mg, 1/2 b.i.d.  3.  Diltiazem ER 120 mg, one daily.  4.  Furosemide 20 mg p.r.n.  5.  Meclizine 25 mg p.r.n.  6.  Nitroglycerin p.r.n.  7.  The patient was placed on some other antihypertensive, which name she      cannot recall.   ALLERGIES:  ACTONEL, CODEINE, TYLENOL AND ULTRACET.   FAMILY HISTORY:  Mother died at age 74.   Father died at age 38.  Both had  heart disease.   SOCIAL HISTORY:  The patient is widowed.  She lives alone.  She is  independent in her activities of daily living.  Her niece is at the bedside.  She does not use tobacco or alcohol.   PHYSICAL EXAMINATION:  VITAL SIGNS:  Temperature 98.4 degrees, blood  pressure 156/63, resting pulse 52, respirations 12.  Oxygen saturation 100%.  HEENT/NECK:  Right carotid bruit which is soft.  She had normal carotid  pulses that had prolonged asystole on both occasions when I touched both the  right and left carotids.  No infection.  LUNGS:  Clear.  HEART:  No murmurs, pulses normal.  ABDOMEN:  Soft, bowel sounds normal.  No hepatosplenomegaly.  EXTREMITIES:  Negative.  NEUROLOGIC:  Awake and alert without dysphagia.  Cranial nerves:  Round and  reactive pupils, status post right iridectomy with normal fundi.  Right  inferior quadrantanopia.  Symmetrical facial strength.  Midline tongue.  Air  conduction greater than bone conduction bilaterally.  Motor examination:  Normal strength, tone and mass.  Good fine motor movements.  No drift.  Sensation:  Hypesthesia in the right arm to pinprick.  Good stereoagnosis.  vibratory sensation and proprioception.  No other deficits.  Cerebellar:  Good finger-to-nose, rapid repetitive movements.  Gait was not tested.  Deep  tendon reflexes were diminished and the patient had bilateral plantar flexor  responses.   IMPRESSION:  1.  Acute left posterior cerebral artery infarction, non-hemorrhagic,      secondary to cardioembolic source.  2.  Remote left posterior cerebral artery infarction, non-hemorrhagic.  3.  Hypertension, dyslipidemia, prior stroke and cardiac arrhythmia as risk      factors for stroke.  4.  Sick sinus syndrome, hyper-sensitive carotid.  5.  Rule out carotid disease.   PLAN:  The patient will be admitted to the cardiac unit.  [facility]  will consult.  MRI stat and  MRA intra and  extra-cranial prior to pacemaker.  Will treat her with heparin.  Will perform a non-invasive workup, looking  for etiology of stroke.      Deanna Artis. Sharene Skeans, M.D.  Electronically Signed     WHH/MEDQ  D:  10/29/2005  T:  10/29/2005  Job:  [id number]   cc:   Ernestina Penna, M.D.  Fax: [phone]   Rollene Rotunda, M.D.  [phone] [address]  [address]  [place]  [place] [address]

## 2011-01-22 NOTE — Assessment & Plan Note (Signed)
[facility]                         ELECTROPHYSIOLOGY OFFICE NOTE   [name], [name]                       MRN:          [id number]  DATE:10/31/2006                            DOB:          1917-09-30    [name] was seen today in the clinic on October 31, 2005 for followup  of her Guidant model #1297 Kipp Brood. Date of implant was November 01, 2005 for carotid sinus hypersensitivity.   On interrogation of her device today, her battery voltage is at  beginning of life. P waves measured 2.9 millivolts with an atrial  capture threshold of 0.3 volts at 0.4 msec and a atrial lead impedance  of 490 ohms. R waves, although measured at 4.9 millivolts, she is  pacemaker dependent with an escape rate of about 35 with a ventricular  pacing threshold of 0.6 volts at 0.4 msec and a ventricular lead  impedance of 470 ohms. There were 387 mode switch episodes noted  totaling 0% of the time since last interrogation. No changes were made  in her parameters today. She will start with telephone checks on an  every 3 month basis with a return office visit in 1 year's time.      Altha Harm, LPN  Electronically Signed      Duke Salvia, MD, [facility]  Electronically Signed   PO/MedQ  DD: 10/31/2006  DT: 10/31/2006  Job #: [id number]

## 2011-01-22 NOTE — Op Note (Signed)
NAME:  [name], [name] NO.:  [id number]   MEDICAL RECORD NO.:  [id number]          PATIENT TYPE:  INP   LOCATION:  2303                         FACILITY:  [facility]   PHYSICIAN:  Duke Salvia, M.D.  DATE OF BIRTH:  07-08-1918   DATE OF PROCEDURE:  11/01/2005  DATE OF DISCHARGE:                                 OPERATIVE REPORT   PREOPERATIVE DIAGNOSIS:  Symptomatic carotid sinus hypersensitivity.   POSTOPERATIVE DIAGNOSIS:  Symptomatic carotid sinus hypersensitivity.   OPERATION PERFORMED:  Dual chamber pacemaker implantation and contrast  venography.   DESCRIPTION OF PROCEDURE:  Following the obtaining of informed consent, the  patient was brought to the electrophysiology laboratory and placed on the  fluoroscopic table in supine position.  After routine prep and drape of the  left upper chest, lidocaine was infiltrated in the prepectoral subclavicular  region.  An incision was made and carried down to the layer of the  prepectoral fascia using electrocautery and sharp dissection.  A pocket was  formed similarly. Hemostasis was obtained.   Thereafter attention was turned to gaining access to the extrathoracic left  subclavian vein which was accomplished with moderate difficulty.  We had  blood draw back a number of times. However, on each occasion I tried to push  past the wire, it would hang up.  I then did a contrast injection  demonstrating both patency of the extrathoracic left subclavian vein as well  as some perivenous dissection. We were then able to cannulate it with a  Wholey wire successfully.  I double wired it for access and then  sequentially passed Medtronic 5076 58 cm active fixation ventricular lead  marked with a tie and a Medtronic 5076 52 cm active fixation atrial lead.  The ventricular lead serial number was  [id number] and the atrial lead was  serial number [id number].  Under fluoroscopic guidance, the right  ventricular lead was  manipulated to the right ventricular septum where the  bipolar R wave was 5.6 mV with a pacing impedance of 938 ohms, a threshold  of 0.6 V at 0.5 msec and bipolar P wave of 2.6 mV at the base of the right  atrial appendage with impedance of 761 and threshold of 1.1 volt at 0.5  msec.  __________ with both were quite good.  The leads were secured to the  prepectoral fascia and then attached to a Guidant Insignia 1+ 1297 pulse  generator, serial number [id number].  This device was used because of its sudden  brady response feature.  The pocket was copiously irrigated with antibiotic  containing saline solution.  Hemostasis was assured and the leads and the  pulse generator were placed in the pockets and secured to the prepectoral  fascia.  The wounds were closed in three  layers in the normal fashion.  The wound was washed and dried and a benzoin  and Steri-Strip dressing was applied.  Needle counts, sponge counts and  instrument counts were correct at the end of the procedure according to the  staff.  The patient tolerated the procedure without apparent complication.  ______________________________  Duke Salvia, M.D.     SCK/MEDQ  D:  11/01/2005  T:  11/02/2005  Job:  [id number]   cc:   Pramod P. Pearlean Brownie, MD  Fax: [phone]   Electrophysiology Laboratory   [facility]

## 2011-01-22 NOTE — Op Note (Signed)
NAME:  [name], [name] NO.:  [id number]   MEDICAL RECORD NO.:  [id number]                   PATIENT TYPE:  AMB   LOCATION:  ENDO                                 FACILITY:  [facility]   PHYSICIAN:  Pershing Cox, M.D.            DATE OF BIRTH:  May 13, 1918   DATE OF PROCEDURE:  06/20/2002  DATE OF DISCHARGE:                                 OPERATIVE REPORT   PREOPERATIVE DIAGNOSES:  Enlarging bilateral cystic adnexal masses and  history of endometrial polyp.   POSTOPERATIVE DIAGNOSES:  Serous cystadenoma left ovary, benign cyst right  ovary, history of endometrial polyps.   DESCRIPTION OF PROCEDURE:  Exploratory laparotomy, lysis of adhesions.   ANESTHESIA:  General endotracheal and Marcaine 0.25% for the skin.   SURGEON:  Pershing Cox, M.D.   ASSISTANT:  Chester Holstein. [name], M.D.   INDICATIONS FOR PROCEDURE:  [name] is 77 years old. She has been  followed in my office since May of 1982. She had a CT scan performed for  persistent back pain. That CT scan showed fluid in her uterus and abnormal  ovary. She was evaluated and found to have a normal CA 125. In watching the  ovary it stayed stable and she was taken to the operating room in June of  2002 for hematometra and resection of endometrial polyps. This tissue was  benign. We kept following her ovaries hoping that they would stay stable but  at the time of her last examination, her left ovary had increased  significantly in size and this was confirmed by Dr. Mariel Sleet at the time of  a repeat CT scan. The patient was uneasy and requested that her ovaries be  removed and she is brought to the operating room today for that procedure.  Because she has a history of colon polyps and was due for a colonoscopy,  this was coordinated for Dr. Ewing Schlein and her colonoscopy was performed this  morning prior to our surgery. There were no significant findings other than  diverticular disease from the  nodes provided from this procedure.   OPERATIVE FINDINGS:  There was extensive adhesive disease of the omentum and  attached transverse colon to the abdominal incision arising about 2 cm below  the umbilicus. This was mobilized as necessary in order to complete our  operation but was not completely mobilized as it was very broadly attached.  There was no evidence of ascites. Both ovaries were adherent to the  posterior cul-de-sac with very fine adhesions. The uterus itself was very  small. Both ovaries were cystic. Frozen section performed on the left ovary  showed a benign serous cystadenoma. There was also endosalpingiosis. The  right tube was open and was cystic in nature and the endometrium showed no  more polyps.   DESCRIPTION OF PROCEDURE:  [name] was brought to the operating room  with an IV in place. She received  a gram of Ancef in the holding area. Thigh  high PAS stockings were placed on her lower extremities. Supine on the OR  table, general endotracheal anesthesia was administered. She was then placed  in the frog leg position and anterior abdominal wall, perineum and vagina  were prepped with Hibiclens, Foley catheter was sterilely inserted in the  bladder.   The patient was draped for a midline incision. Using her old lower midline  incision, Marcaine 0.25% was injected into the scar. A knife was then used  to incise an 8 cm incision and carried down through the subcutaneous tissues  until the fascia was encountered. The fascia itself was quite thin. Opening  it lead [place] to the peritoneum which was tinted and opened atraumatically.  This peritoneal incision was extended superiorly and inferiorly.  Right  angled retractors were used to left it and 500 cc were instilled into the  peritoneal cavity. The patient was placed in the Trendelenburg and then  reverse Trendelenburg as we agitated the fluid in the abdomen. The 500 cc  were then aspirated. We retrieved a total of  400 cc of this fluid. The  omentum and transverse colon were adherent to the scar at the top of the  incision. Using Metzenbaum scissors, this was taken down for a length of  about 4 cm but it was attached broadly above and this was left intact. A  Balfour retractor was used to retract the skin edges with the skin protected  by towels. A bladder blade was used to retract the bladder. The very  distended rectosigmoid and transverse colon were packed into the upper  abdomen with soft laps.   Long Kelly clamps were placed along the adnexa around a very tiny uterus. On  the patient's left, there were numerous adhesions of the rectosigmoid to the  sidewall. These were taken down by sharp dissection. The IP ligament on the  left was then visualized. We opened the peritoneum along the surface of the  peritoneum. The round ligament had been transected earlier. The ureter was  visualized. The IP ligament was clamped, cut, suture and free tie ligated.  Lifting the ovary at its attachment from the uterus, it was separated from  the uterus and sent for frozen section. A small bleeder was encountered  along the right perimetrium. This was clamped, the ureter was visualized and  this was suture ligated. The uterine artery was skeletonized on the  patient's left and then clamped with a Masterson clamp. This was cut and  suture ligated.  A second clamp around the first pedicle was placed and  suture ligated so that the uterine artery was effectively doubly ligated.   On the patient's right, the uterine ligament was easily visualized and  suture ligated and transected. The IP ligament was gathered in a way such  that the ureter could be visualized. It was normal in appearance and nowhere  near the IP ligament. The IP ligament was identified, clamped, cut and free  tie ligated. The right ovary was skeletonized along its attachment to the uterus and then removed for better visualization. All blood was taken  down  off the lower uterine segment and then using sharp dissection taken off the  upper cervix. The uterine artery was clamped on the right and doubly ligated  in a manner identical to the left. A straight clamp was placed on the right  and this was Heaney ligated. A curved clamp was then placed beneath this and  suture ligated using Heaney technique.   A straight clamp was used along the cervix on the patient's left and this  brought [place] into the vagina. The pedicle was Heaney ligated. Metzenbaum  scissors were used to excise the cervix from the upper vagina which was held  with Allis clamps. An angled suture was placed on the right and then used to  reef the posterior vaginal wall. An angled suture was placed on the left and  then used to reef the anterior vagina. This was performed with the locking  technique. The vagina was hemostatically stable and the walls were brought  together with a figure-of-eight suture. At no time did we come close to the  bladder. It was carefully visualized during the dissection. The right  fallopian tube and ovary were visualized by the pathologist and felt to be  benign. At this point, we irrigated looking for any evidence of bleeders and  where small bleeders were encountered, these were coagulated with cautery.  Rectosigmoid was then layered down into the cul-de-sac. The small bowel is  laid on top of this and the distal omentum was brought down over the small  bowel. A malleable retractor was used as we closed the anterior abdominal  wall using double stranded Prolene starting at the top with a loop suture.  This suture was then run two-thirds of the length of the incision using 1 cm  margins on each edge of the fascia. A second double strand of Prolene was  started from below and run the other 1/4 of the incision. Two ends of double  stranded Prolene were tied together using 12 knots. The stitch was then  inverted into the incision and using 2-0 Vicryl  sutures the knot was buried  in the subcutaneous tissue. I used three individual stitches to bury the  knot securely. Cautery was used to make sure there was no bleeding from the  subcutaneous tissues. The subcutaneous tissues were irrigated and skin  staples were applied. A pressure dressing was placed on the abdomen.   There were no intraoperative complications. Specimens, peritoneal washings,  left fallopian tube and ovary for frozen section, uterus, right fallopian  tube and ovary. Estimated blood loss 150 cc. Urine output 200 cc.  Crystalloid 2200 cc.                                               Pershing Cox, M.D.    MAJ/MEDQ  D:  06/20/2002  T:  06/20/2002  Job:  [id number]   cc:   Ladona Horns. Neijstrom, M.D.  [address]  [place]  [place] [address]  Fax: [phone]   Ernestina Penna, M.D.  [address] [place]  [place] [address]  Fax: [phone]

## 2011-01-22 NOTE — Discharge Summary (Signed)
NAME:  [name], [name] NO.:  [id number]   MEDICAL RECORD NO.:  [id number]          PATIENT TYPE:  INP   LOCATION:  4711                         FACILITY:  [facility]   PHYSICIAN:  Marlan Palau, M.D.  DATE OF BIRTH:  1917/10/29   DATE OF ADMISSION:  10/29/2005  DATE OF DISCHARGE:                                 DISCHARGE SUMMARY   ADMISSION DIAGNOSES:  1.  Right eye visual loss.  2.  Syncopal event with cardiac sinus hypersensitivity.  3.  Hypertension.   DISCHARGE DIAGNOSES:  1.  Left occipital cerebrovascular infarction.  2.  Atrial fibrillation with rapid ventricular response.  3.  Sick sinus syndrome, carotid sinus hypersensitivity.  4.  Hypertension.   PROCEDURES DURING THIS ADMISSION:  1.  MRI scan of the brain.  2.  MR angiogram.  3.  Cardiac pacer placement.  4.  Two-dimensional echocardiogram.  5.  Carotid Doppler study.   COMPLICATIONS TO ABOVE PROCEDURES:  None.   HISTORY OF PRESENT ILLNESS:  [name] is an 77 year old right-handed  white female, born 1918/04/10, with a history of hypertension and  prior cerebrovascular infarct that was silent, found on CT of the head.  The  patient has been admitted to [facility] after she had loss off  vision in her right eye around 8 a.m.  The patient was seen at [facility] and was noted to have dizziness, nausea and  blurred vision in the right eye and was sent to the [facility] for  evaluation of TIA.  In the emergency room, the patient was noted to have  asystole with minimal pressure on the carotid vessels.  The patient was  admitted for an evaluation.   PAST MEDICAL HISTORY:  1.  Left occipital stroke.  2.  History of cardiovascular disease with angina, seen previously by Dr.      Antoine Poche.  3.  Anemia.  4.  Vitamin B12 deficiency.  5.  Spinal stenosis.  6.  History of gastritis with ulcers in the past.  7.  Osteoarthritis.  8.   Hypothyroidism.  9.  Bilateral iridectomies.  10. Lumbar laminectomy.  11. Appendectomy.  12. Hysterectomy.  13. Cholecystectomy.  14. Pacer placement during this admission.   MEDICATIONS ON ADMISSION:  1.  Levoxyl 100 mcg a day, except for Saturdays and Sundays, taking 25 mcg.  2.  Metoprolol 50 mg one-half twice a day.  3.  Diltiazem ER 120 mg daily.  4.  Lasix 20 mg a day.  5.  Meclizine 25 mg if needed.  6.  Nitroglycerin if needed.   ALLERGIES:  The patient has allergies to ACTONEL, CODEINE, TYLENOL,  ULTRACET.   HABITS:  The patient does not smoke or drink.   COMMENT:  Please refer to history and physical dictation summary for social  history, family history, review of systems and physical examination.   LABORATORY VALUES:  Laboratory values were notable for an INR of 2.6 on  discharge; a white count of 8.1, hemoglobin of 12.9, hematocrit of 38.5, MCV  of 87.5, platelets  of 203,000; sodium of 140, potassium 4.4, chloride of  108, CO2 of 28, glucose of 105, BUN of 20, creatinine 1.1, calcium of 8.6,  total protein of 5.5, albumin of 3.5, AST of 21, ALT of 13, ALP of 61, total  bilirubin 0.6.  Hemoglobin A1c was 6.3.  Homocysteine level 15.5.  CK of  121.  Cholesterol level was 151, triglycerides of 85, HDL 51, LDL of 83.  TSH was 1.5.  Urinalysis reveals specific gravity of 1.008, pH of 5.5.   RADIOLOGIC FINDINGS:  Chest x-ray shows improving pneumothorax on the left,  small left pleural effusion.   ACCESSORY CLINICAL DATA:  EKG reveals atrial fibrillation with rapid  ventricular response, premature ventricular complexes, left bundle branch  block, rate of 132.   HOSPITAL COURSE:  This patient was admitted to [facility] after an  episode of transient visual disturbance involving the right eye.  The  patient had episode of asystole with minimal pressure on the carotid  arteries.  For this reason, a Cardiology consult was obtained.  The patient  was felt to  require a cardiac pacemaker.  The patient has undergone an MRI  scan of the brain showing an acute stroke in the left occipital area.  MR  angiography shows no significant cerebrovascular disease.  The patient  underwent a carotid Doppler study as well that was unremarkable.  The  patient was noted to have paroxysmal atrial fibrillation in the hospital  with rapid ventricular response.  The patient was felt to have sick sinus  syndrome.  Plans for Coumadin were made.  The patient underwent a dual-  chamber pacemaker implant on the 26th of February 2007.  Following this, the  patient was found to have a left pneumothorax; Pulmonary consult was  obtained.  The patient was followed and eventually required a chest tube  placement.  After several days of the chest tube, the pneumothorax cleared.  Pacemaker has been interrogated and seems to be functioning properly.  The  patient has minimal neurologic deficit following the stroke event.  A  negative bubble study was noted by transesophageal echo; no patent foramen  ovale was seen.  The patient has been converted to Coumadin and INR is 2.6  at the time of discharge.  The patient has been followed by Cardiology  throughout this admission.  At the current time, the patient has minimal  right visual field deficit, is fully ambulatory with good strength  throughout.  Normal speech is seen.  At this point, the patient is to be  discharged to home with some home health physical therapy for several days.   DISCHARGE MEDICATIONS:  The patient will be on:  1.  Coumadin 3 mg a day.  2.  Diltiazem 120 mg daily.  3.  Lasix 20 mg a day.  4.  Synthroid -- the patient is to take 100 mcg a day, Monday through      Friday, and 25 mcg on Saturdays and Sundays.  5.  The patient is on Lopressor 25 mg twice a day.  6.  Aciphex 20 mg daily.   FOLLOWUP:  The patient will follow up for a pro time check on the 8th of March 2007 at 9 a.m., Dr. Kathi Der office, will be  seen by Dr. Juanito Doom on  the 2nd of April 2007 at 9:45 a.m., Dr. Graciela Husbands on Jan 31, 2006 at 9:30 a.m.,  Dr. Pearlean Brownie in 3-4 weeks following discharge.   CONDITION ON DISCHARGE:  Again, at the time of discharge, the patient is  bright, alert, cooperative, in no apparent distress.      Marlan Palau, M.D.  Electronically Signed     CKW/MEDQ  D:  11/09/2005  T:  11/10/2005  Job:  [id number]   cc:   Ernestina Penna, M.D.  Fax: [phone]   Jesse Sans. Wall, M.D.  [address]  [address]  [place]  [place] [address]   Duke Salvia, M.D.  [address]  [address]  [place]  [place] [address]

## 2011-01-22 NOTE — Op Note (Signed)
[facility]  Patient:    MATISSE, ROSKELLEY                         MRN: [id number] Proc. Date: 02/27/01 Attending:  Pershing Cox, M.D.                           Operative Report  PREOPERATIVE DIAGNOSES:  Abnormal uterine contents on sonogram, possible endometriosis polyps.  POSTOPERATIVE DIAGNOSES:  Hematometra, endometriosis polyps.  PROCEDURE:  Examination under anesthesia, fractional D&C, hysteroscopy with resection of endometrial polyps.  ANESTHESIA:  MAC plus Marcaine paracervical block.  SURGEON:  Pershing Cox, M.D.  INDICATIONS FOR PROCEDURE:  This 77 year old woman has been evaluated by [facility] for weight loss. A CAT scan was performed which showed adnexal masses and an abnormal uterus. She came to my office for consultation and on sonogram, we found complex ovarian masses which have smooth walls, no abnormal flow, and are consistent with benign disease. Uterine contents showed hematometra with small polyps lining the anterior abdominal wall. The patient was brought to the operating room today for examination under anesthesia and D&C to examine these endometrial polyps. We will be following the complex ovarian masses with sonogram. The patient is strongly committed to avoid surgery if at all possible.  OPERATIVE FINDINGS:  The patients uterus is small, it sounds to 2.5 cm. There are no palpable adnexal structures on exam. The uterine contents showed approximately 15 cc of heme colored fluid. On sonogram, the walls of the uterus were atrophic except for two flat polyps lining the anterior uterine wall.  DESCRIPTION OF PROCEDURE:  Sian Rockers was brought to the operating room with an IV in place. She was placed supine on the operating table and IV sedation was administered. She was then placed into Allen stirrups and prepped with a Hibiclens solution. The Foley catheter was used to empty the bladder. She  was draped sterilely for a vaginal procedure. A small Pedersen bivalve speculum was introduced into the vagina. The cervix was visualized, Marcaine was injected into the anterior cervix which was grasped with a single tooth tenaculum. A 0.25% Marcaine paracervical block was administered at the 3, 4, 7 and 8 positions using a total volume of 12 cc. A Kevorkian curette was used to obtain endocervical curettings. The uterine sound then passed easily to a depth of 2.5 cm. Serial Pratt dilators were used to dilate to size 25. The diagnostic scope was introduced and photographs were taken of the uterine contents. A small sharp curette was used in an attempt to retrieve the polypoid tissue without further dilatation of the cervix. This was unsuccessful and documented by photograph. The cervix was then further dilated to size 33 Pratt dilator and the resectoscope was introduced. Using sorbitol irrigation and through and through visualization, a right angled wire was used to pass current to resect the endometrial polyps. There was minimal bleeding from the uterus following the resection. The specimens were retrieved and sent for final pathology. The patient tolerated the procedure well and was taken to the recovery room in excellent condition. DD:  02/27/01 TD:  02/27/01 Job: [id number]

## 2011-01-22 NOTE — Consult Note (Signed)
NAME:  [name], [name] NO.:  [id number]   MEDICAL RECORD NO.:  [id number]          PATIENT TYPE:  INP   LOCATION:  2303                         FACILITY:  [facility]   PHYSICIAN:  Coralyn Helling, M.D.      DATE OF BIRTH:  08-Nov-1917   DATE OF CONSULTATION:  11/01/2005  DATE OF DISCHARGE:                                   CONSULTATION   REFERRING PHYSICIAN:  Dr. Jens Som.   REASON FOR CONSULTATION:  Left pneumothorax.   [name] is an 77 year old female who was admitted on October 29, 2005,  with loss of vision in her right eye. She was later noted to have an acute  left PCA infarct with a likely cardiac source. She was also found to have  carotid hypersensitivity and syncope, and as a result the determination was  made for her to have a pacemaker insertion, which she had undergone earlier  today. She had received a dual chamber pacemaker. After the procedure the  patient noted developing chest pain, and a chest x-ray done at that time  showed a moderate size left pneumothorax. Currently the patient was  complaining of moderate pain in her left side and some difficulty with her  breathing, but did not appear to be in acute distress otherwise.   PAST MEDICAL HISTORY:  1:  Prior CVA  1.  Coronary artery disease.  2.  Anemia secondary to B12 deficiency.  3.  Spinal stenosis.  4.  Atrial fibrillation.  5.  Peptic ulcer disease.  6.  Osteoarthritis.  7.  Hypothyroidism.   PAST SURGICAL HISTORY:  1.  Bilateral iridectomies.  2.  Lumbar laminectomy.  3.  Appendectomy.  4.  Hysterectomy.  5.  Cholecystectomy.   ALLERGIES:  1.  ACTONEL.  2.  CODEINE.  3.  TYLENOL.  4.  ULTRACET.   FAMILY HISTORY:  Significant for both parents having heart disease.   SOCIAL HISTORY:  The patient is widowed. She lives alone. She is independent  with her activities of daily living. There is no history of tobacco or  alcohol use.   MEDICATIONS:  1.  Ancef 1 gram IV q.8 h.  2.   Colace 200 mg p.o. q.h.s.  3.  Coumadin 3 mg q.d., which she just started today.  4.  Lasix 20 mg q.d.  5.  Lopressor 25 mg q.d.  6.  Protonix 40 mg q.d.  7.  Synthroid 100 mcg q.d.  8.  Tiazac 120 mg q.d.   PHYSICAL EXAMINATION:  GENERAL:  She is awake, alert, and oriented, does not  appear to be in acute distress.  VITAL SIGNS:  Heart rate is 66. Respiratory rate is 13. Blood pressure is  152/65. I-saturation is 99%.  HEENT:  There is no jugular venous distension, no lymphadenopathy. No oral  lesions. No sinus tenderness.  HEART:  Reveals S1, S2. No murmurs.  CHEST:  There was decreased breath sounds at the left apex.  ABDOMEN:  Soft, nontender, positive bowel sounds.  EXTREMITIES:  There was no cyanosis, clubbing or edema.  NEUROLOGIC:  She was alert  and oriented x3. 5/5 strength.   LABORATORY VALUES:  Most recent laboratory values from October 29, 2005:  WBCs  8.7, hemoglobin 11.6, hematocrit is 34.5, platelets of 240. Sodium was  141, potassium is 4.4, chloride is 108, CO2 is 28. BUN is 20. Creatinine is  1.1. Glucose is 105. PT is 13.2. INR is 1. PTT is 31. AST is 21. ALT is 13.  ALP is 61. Bilirubin is 0.6. Albumin is 3.5. Calcium is 8.6.   IMPRESSION:  This is an 77 year old female who is status post pacemaker  insertion earlier in the day, having chest pain with some degree of dyspnea.  Chest x-ray shows a moderate size left pneumothorax. The risks and benefits  of chest tube insertion were discussed with the patient. The patient was  given the opportunity to ask any questions, and after complete discussion  the patient signed informed consent. She was then pre-medicated with 2 mg of  Versed IV, as well as 100 mg of fentanyl IV; then she had 50 cc 1% lidocaine  instilled for topical anesthesia. Incision was made in the left mid axillary  line. Blunt dissection was done down to the intercostal muscles and then the  pleural space was entered. A 20 French chest tube was  inserted, and  condensation was noted in the tubing as well as fluid oscillation with  respiratory rate. The chest tube was connected to suction at 20 cm of water  pressure and no air bubbles were noted. The patient tolerated the procedure  well otherwise, and her vital signs remained stable throughout the  procedure. There were no other apparent immediate complications. A chest x-  ray is pending.      Coralyn Helling, M.D.  Electronically Signed     VS/MEDQ  D:  11/01/2005  T:  11/02/2005  Job:  [id number]

## 2011-01-22 NOTE — Consult Note (Signed)
NAME:  [name], [name] NO.:  [id number]   MEDICAL RECORD NO.:  [id number]                   PATIENT TYPE:  AMB   LOCATION:  ENDO                                 [facility]ACILITY[facility]  [facility]   PHYSICIAN:  Rollene Rotunda, M.D. [facility]            DATE OF BIRTH:   DATE OF CONSULTATION:  DATE OF DISCHARGE:  06/20/2002                                   CONSULTATION   PRIMARY CARE PHYSICIAN:  _______   REASON FOR CONSULTATION:  Patient with arrythmia.   HISTORY OF PRESENT ILLNESS:  The patient is a 77 year old who I had seen  once in my [facility] office for preoperative evaluation.  She has a history of  nonaffective coronary disease as described below.  She had no symptoms at  the time of her June 06, 2002 appointment.  However, she did have a left  bundle branch block on EKG which was new.  She was referred for stress  perfusion study which demonstrated no evidence of ischemia.  The EF was 58%.   The patient is now status post hysterectomy and colonoscopy.  She is doing  well with mild postoperative discomfort.  However, she has had arrhythmias  on her EKG with some sustained wide complex arrhythmias as well as frequent  unifocal ectopic beats.  She has had no presyncope or syncope with this.  She states she has felt well despite this.  She has had some difficulty  urinating.  She was told to lay down and she did start to notice her heart  beating strongly and strong beats in her head.  She does not describe any  chest discomfort and has no shortness of breath, PND or orthopnea.   PAST MEDICAL HISTORY:  1. Nonaffective coronary disease (1992 catheterization:  70% septal     perforator stenosis and 30-50% LAD stenosis).  2. Hypothyroidism.  3. Cataracts.  4. Herniated L4 and L5 disk.  5. Migraine headaches.  6. Gastritis.  7. Hypertension.  8. Anemia.  9. Osteoarthritis.   PAST SURGICAL HISTORY:  1. Billroth II gastrectomy 1978.  2. Cataract surgery.  3.  Appendectomy.  4. Hemilaminectomy 1990's.  5. Abdominal hysterectomy.   ALLERGIES:  Allergic CODEINE.   MEDICATIONS:  1. Metoprolol 25 mg q.d.  2. Levoxyl 0.1 mg q.d.  3. Cardizem 120 mg q.d.  4. Lasix 20 mg p.r.n.   SOCIAL HISTORY:  The patient has been married for 60 years.  She has one  child and four grandchildren.  She stopped smoking a couple of years ago.  She does not drink alcohol.   FAMILY HISTORY:  Noncontributory for early coronary disease or arrythmia.   REVIEW OF SYSTEMS:  Positive for weight loss; neck and back pain.  Otherwise  negative for other systems.   PHYSICAL EXAMINATION:  VITAL SIGNS:  Blood pressure 137/56, heart rate 60's  and regular with frequent ectopy.  GENERAL:  The patient  is in no distress.  HEENT:  Unremarkable.  Pupils equal, round and reactive to light.  Fundi not  visualized.  Oral mucosa unremarkable.  NECK:  No jugular venous distention, wave form within normal limits.  Carotid upstroke brisk and symmetric.  No bruits or thyromegaly.  Lymphatics  no adenopathy.  LUNGS:  Clear to auscultation.  CHEST:  Unremarkable.  PMI nondisplaced and sustained.  S1 and S2 within  normal limits.  No S3.  No S4.  No murmurs.  ABDOMEN:  Bandaged.  Positive bowel sounds, minimal incisional tenderness.  No obvious organomegaly.  SKIN:  No rashes.  EXTREMITIES:  2+ pulses.  No cyanosis or clubbing.  No calf tenderness or  calf swelling.  NEUROLOGIC:  Patient oriented to person, place and time.  Cranial nerves II-  XII grossly intact.  Motor grossly intact.   LABORATORY DATA:  EKG sinus rhythm with frequent premature atrial  contractions, left bundle branch block.   ASSESSMENT/PLAN:  1. Arrythmia.  The patient has had frequent ectopy.  I suspect that the     majority of this is atrial.  She has had some runs of this which again     may well be supraventricular.  There are no 12-lead EKGs to evaluated any     sustained arrhythmias.  However, the  morphology on rhythms appears to be     identical to her baseline complex.  She has been relatively asymptomatic     with this except for some sensation of increased heart beats.  She has     not had any chest discomfort and has had not presyncope or syncope.  She     is hypokalemic.  She has also received only minimal doses of her previous     medications as she has been NPO.  At this point, the plan would be to     repeat potassium, as is being done.  She will be restarted on her     Cardizem.  She will have her Lopressor continued and this may need to be     increased to b.i.d. dosing.  We will cycle enzymes, although I do not     suspect she will need any further cardiovascular testing.  She can     continue on telemetry and with usual postoperative care.  2. Hypertension.  Blood pressures appear to be well controlled.  Again we     will manage this in the context of also beta-blocker and Cardizem for     control of arrhythmias.  3. Hypothyroidism.  Check TSH.                                               Rollene Rotunda, M.D. [facility]    JH/MEDQ  D:  06/21/2002  T:  06/22/2002  Job:  [id number]

## 2011-01-22 NOTE — Procedures (Signed)
[place]  Patient:    [name], [name]                       MRN: [id number] Proc. Date: 01/05/00 Adm. Date:  [id number] Disc. Date: [id number] Attending:  Nelda Marseille CC:         Monica Becton, M.D.             Ladona Horns. Mariel Sleet, M.D.                           Procedure Report  REASON FOR PROCEDURE:         Iron deficiency anemia in a patient with                               Billroth II gastrectomy, due for colonic                               screening.  PROCEDURE:                    Colonoscopy.  INFORMED CONSENT:  Consent was signed after risks, benefits, methods, and options thoroughly discussed multiple times in the office.  MEDICATIONS USED:  Demerol 90 mg, Versed 7 mg.  DESCRIPTION OF PROCEDURE:  Rectal inspection remarkable for external hemorrhoids.  Digital exam was negative.  The pediatric video colonoscope was inserted with some difficulty due to a diverticulized field left side of the colon and some tortuosity.  We were able to advance to the approximate level of the mid-transverse.  At that point, the scope began to loop and we did have to withdraw the loop before we could advance.  With retrieval of the loop with abdominal pressure, we were easily able to advance to he ascending colon.  We could see the ileocecal valve in the distance.  No significant obvious abnormality was seen on insertion; however, despite rolling her on her back and her right side with multiple abdominal pressures, we could not advance to the cecal pole but to the level of the ileocecal valve.  We elected to withdraw at this juncture.  The ascending colon was normal, as was the majority of the transverse; however, in the distal transverse, a 1.5 cm sessile polyp was seen.  To get a better angle on it, we did snare about half of it.  Electrocautery was applied and two pieces were removed, which were suctioned through the scope and collected in  the trap.  We snared an additional small piece but could not get the snare around any further part of the polyp based on its location and its shape, and we went ahead and proceeded with four hot biopsies of the various areas of residual polyp.  There was no significant obvious bleeding.  We elected to withdraw the scope at this juncture.  Other than the left-sided diverticula and tortuosity, no other abnormalities were seen as we slowly retreated back to the rectum.  Once back in the rectum, the scope was retroflexed, pertinent for some small internal hemorrhoids.  The scope was _____, the _____ was withdrawn, and the scope removed.  The patient tolerated the procedure adequately.  There was no obvious immediate complication.  ENDOSCOPIC DIAGNOSES: 1. Internal and external hemorrhoids. 2. Left-sided significant diverticula. 3. Tortuous colon. 4. A  1.5 cm sessile midtransverse polyp, status post snare x 2 and hot biopsy    x 4. 5. Otherwise within normal limits to the level of the ileocecal valve, unable    to enter the cecal pole.  PLAN:  Await pathology to determine recheck.  Consider, CT, barium enema, or small bowel follow-through if her iron does not come up or if on recheck she is guaiac-positive.  We will see her back in [place] in one to two months but have her call me sooner p.r.n. and otherwise wait on Dr. Arnell Asal follow-up CBC to decide about IV iron. DD:  01/05/00 TD:  01/07/00 Job: [id number]

## 2011-01-22 NOTE — Assessment & Plan Note (Signed)
[facility]                            CARDIOLOGY OFFICE NOTE   [name], [name]                       MRN:          [id number]  DATE:12/09/2006                            DOB:          04-14-1918    [name] comes in today for further management of her atrial  fibrillation and carotid artery hypersensitivity, presenting with severe  bradycardia, complete heart block, and a TIA/stroke. She is status post  dual chambered pacemaker by Dr. Berton Mount.   She last saw [place] in the office on 10/31/06 for pacemaker check.   She was found to be pacemaker dependent during that check with an escape  rate of about 35 beats-per-minute.   She says that she just does not feel good. She says that ever since she  has had the stroke, she just has not recovered. She says that she is  frustrated and miserable that she can not get out and do stuff like she  used to. She is 77 years old.   She denies any pre-syncope or syncope.   MEDICATIONS:  1. Diltiazem extended release 120 mg daily.  2. Lopressor 50 mg b.i.d. for hypertension.  3. Levoxyl 100 mcg daily and 25 mg a day on Saturday and Sunday.  4. Coumadin as directed.  These are all followed by Dr. Christell Constant at the [facility].   PHYSICAL EXAMINATION:  VITAL SIGNS:  Her blood pressure is 154/78, pulse  70 and regular, weight 151 up 6.  GENERAL:  She looks somewhat depressed.  HEENT:  Normocephalic atraumatic. PERLA. Extraocular movements intact.  Sclerae clear. Facial symmetry is normal. Carotids are full. I did not  press on them. Thyroid is not enlarged. Trachea is midline.  LUNGS:  Clear.  HEART:  Regular rate and rhythm today.  ABDOMEN:  Soft with good bowel sounds.  EXTREMITIES:  No edema. Pulses are intact, but reduced.  NEUROLOGIC:  Grossly intact.  SKIN:  Shows a few ecchymoses.   ASSESSMENT AND PLAN:  [name] is stable from a cardiovascular  standpoint. She does not feel well, but I  am not sure there is a whole  lot we can do about that. I have told her to make sure that she follows  up closely with Dr. Christell Constant about her blood work, particularly her thyroid  status. We will plan on seeing her back again in a year. She will  telephone checks of her pacemaker every 3 months.     Thomas C. Daleen Squibb, MD, [facility]  Electronically Signed    TCW/MedQ  DD: 12/09/2006  DT: 12/10/2006  Job #: [id number]   cc:   Ernestina Penna, M.D.

## 2011-01-22 NOTE — Telephone Encounter (Signed)
Patient needs to keep in office appointment for yearly check and will resume TTM's every 90 days.  Patient in agreement

## 2011-01-22 NOTE — Discharge Summary (Signed)
NAME:  [name], [name] NO.:  [id number]   MEDICAL RECORD NO.:  [id number]                   PATIENT TYPE:  AMB   LOCATION:  ENDO                                 FACILITY:  [facility]   PHYSICIAN:  Pershing Cox, M.D.            DATE OF BIRTH:  02/12/1918   DATE OF ADMISSION:  06/20/2002  DATE OF DISCHARGE:  06/20/2002                                 DISCHARGE SUMMARY   ADMISSION DIAGNOSIS:  Bilateral cystic adnexal masses increasing in size.   DISCHARGE DIAGNOSES:  1. Benign ovarian masses with cystic endosalpingiosis.  2. Cardiac arrythmia.  3. Anemia.   HISTORY OF PRESENT ILLNESS:  For details of the patient's admission and  history and physical, please see the note transcribed on 06/20/02.  Basically, this patient is 77 years old.  She is status post hysterectomy in  the past, and was referred to our office when a CT scan was ordered for  another problem and showed abnormal ovaries and fluid in the patient's  uterus.  She had a normal CA-125, and subsequently underwent D&C  hysteroscopy finding a benign endometrial polyp and distorted proliferative  endometrium in 6/02.  We have been following her ovarian cyst since that  time, and at the time of her last visit her ovarian masses have increased in  size approximately 1 cm in all dimensions.  Color Dopplers showed no  evidence of abnormal blood flow, but because of the increasing size, the  patient elected to proceed with excision.  Because of the problems she had  with her uterus in the past, she asked that we remove her uterus as well.  In preparation for this procedure, she has cleansed her bowel, and Dr. Ewing Schlein  performed a colonoscopy on her prior to coming to the operating room for  exploratory laparotomy.   HOSPITAL COURSE:  The patient was placed under general anesthesia, and  exploratory laparotomy, total abdominal hysterectomy, and bilateral salpingo-  oophorectomy were performed.   Lysis of adhesions was also part of this  procedure.  Frozen section from the ovaries showed serous cyst adenoma of  the left ovary.  The uterus and right fallopian tube and ovary appeared to  be benign and were sent for permanent section only.  Estimated blood loss  was scant, 150 cc.  Urine output was 200 cc.  There were absolutely no  intraoperative complications.  The patient was seen on the evening after  surgery and was doing well.  Pain was well controlled.  She had no signs of  bleeding.  Vital signs were stable.  On the morning of postoperative day #1,  she was seen by Sheliah Plane, and other then a low urine output, was felt  to be doing fine.  When I saw her on the evening of postoperative day #1,  she stated she felt very bad.  It was not pain, but she felt like  she needed  to vomit and felt a sensation of choking.  On examining her, her blood  pressure was stable.  Her pulse was 80, but according to the nurse, they  were beginning to see runs of ventricular tachycardia.  The patient's  abdomen was quite distended.  The patient's IV fluids were restarted at 50  cc per hour, and she was made n.p.o.  Foley catheter was inserted, and I  asked the cardiologist to see her in consultation.  At that point, I began  reviewing her morning labs which were grossly abnormal.  Her hemoglobin had  dropped to 8.5, with a preoperative of 12.  Her serum potassium was 3.0,  with a preoperative of 4.4.  We began replacing her potassium and a type and  cross was ordered.  She was seen in consultation by the cardiologist and  recommendations were made to transfer her to a unit bed for close monitoring  overnight.  They began a Cardizem drip and her arrythmia was gradually  converted to a normal rhythm.  The patient began to feel well and was back  to normal by the morning of postoperative day #2.  That morning, the patient  had slept well, she had no nausea, her pain was minimal, and her vital  signs  were stable.  Nevertheless, repeat hemoglobin was 7.6, and she received 1  unit of packed red blood cells.  She had cardiac enzymes performed, and  these enzymes were abnormal.  Her troponin-I was slightly elevated.  That  evening when seen on rounds she was sleeping soundly and was not awakened.  The nurses reported that she had a very good night.  At that time I tried to  reassess whether there was any obvious reason for her blood loss, but there  clearly was none.  The patient was given Lasix with good response.  On the  morning of postoperative day #3, or 06/23/02, she had some mild nausea after  having grits for breakfast.  She was otherwise stable.  After consulting  with cardiology, decision was made that she could move back to the floor  when the bed was available.  On the morning of postoperative day #4, she was  actually doing well.  She had some nausea after lunch, but was doing better  by the time I saw her.  She complained of significant left calf pain on  palpation.  She could dorsiflex without pain and there was no cord palpable,  but she continued to have a reproducible pain in that calf.  We were able to  obtain Doppler studies of her calf on that afternoon and there was no  evidence of deep vein thrombosis.  On the morning of postoperative day #5,  she had no complaints.  Pain was well controlled with pain medications.  She  was able to tolerate a regular diet.  Her staples were removed and Steri-  Strips were placed, and she was able to be discharged once released by  cardiology.  They gave her prescriptions for Lopressor 25 mg b.i.d.  She was  to take potassium 20 mEq twice a week.  They also renewed her prescription  for Cardizem which had apparently run out, and she was unable to obtained  anymore.  At the time of her discharge, the patient was stable.  She did not require a second unit of blood.  Her hemoglobin at the time of discharge was  10.1.  Her potassium  at the time  of discharge was 3.8.  The remainder of her  electrolytes were normal.                                                 Pershing Cox, M.D.    MAJ/MEDQ  D:  06/29/2002  T:  07/01/2002  Job:  [id number]   cc:   Ernestina Penna, M.D.  [address] [place]  [place] [address]  Fax: [phone]   Ladona Horns. Neijstrom, M.D.  [address]  [place]  [place] [address]  Fax: [phone]

## 2011-02-17 ENCOUNTER — Encounter: Payer: Self-pay | Admitting: Internal Medicine

## 2011-03-16 ENCOUNTER — Encounter: Payer: Self-pay | Admitting: Internal Medicine

## 2011-03-19 ENCOUNTER — Encounter: Payer: Medicare Other | Admitting: Internal Medicine

## 2011-04-10 ENCOUNTER — Observation Stay (HOSPITAL_COMMUNITY)
Admission: EM | Admit: 2011-04-10 | Discharge: 2011-04-12 | Disposition: A | Discharge status: Home / Self Care | Payer: Medicare Other | Attending: Internal Medicine | Admitting: Internal Medicine

## 2011-04-10 ENCOUNTER — Emergency Department (HOSPITAL_COMMUNITY): Payer: Medicare Other

## 2011-04-10 ENCOUNTER — Encounter: Payer: Self-pay | Admitting: Internal Medicine

## 2011-04-10 DIAGNOSIS — I4891 Unspecified atrial fibrillation: Principal | ICD-10-CM | POA: Insufficient documentation

## 2011-04-10 DIAGNOSIS — E039 Hypothyroidism, unspecified: Secondary | ICD-10-CM | POA: Insufficient documentation

## 2011-04-10 DIAGNOSIS — Z950 Presence of cardiac pacemaker: Secondary | ICD-10-CM | POA: Insufficient documentation

## 2011-04-10 DIAGNOSIS — R5381 Other malaise: Secondary | ICD-10-CM

## 2011-04-10 DIAGNOSIS — R5383 Other fatigue: Secondary | ICD-10-CM

## 2011-04-10 DIAGNOSIS — R0609 Other forms of dyspnea: Secondary | ICD-10-CM | POA: Insufficient documentation

## 2011-04-10 DIAGNOSIS — Z8673 Personal history of transient ischemic attack (TIA), and cerebral infarction without residual deficits: Secondary | ICD-10-CM | POA: Insufficient documentation

## 2011-04-10 DIAGNOSIS — G319 Degenerative disease of nervous system, unspecified: Secondary | ICD-10-CM | POA: Insufficient documentation

## 2011-04-10 DIAGNOSIS — Z9181 History of falling: Secondary | ICD-10-CM | POA: Insufficient documentation

## 2011-04-10 DIAGNOSIS — I10 Essential (primary) hypertension: Secondary | ICD-10-CM | POA: Insufficient documentation

## 2011-04-10 DIAGNOSIS — D509 Iron deficiency anemia, unspecified: Secondary | ICD-10-CM | POA: Insufficient documentation

## 2011-04-10 DIAGNOSIS — R0789 Other chest pain: Secondary | ICD-10-CM | POA: Insufficient documentation

## 2011-04-10 DIAGNOSIS — Z79899 Other long term (current) drug therapy: Secondary | ICD-10-CM | POA: Insufficient documentation

## 2011-04-10 DIAGNOSIS — R0989 Other specified symptoms and signs involving the circulatory and respiratory systems: Secondary | ICD-10-CM | POA: Insufficient documentation

## 2011-04-10 LAB — URINALYSIS, ROUTINE W REFLEX MICROSCOPIC
Bilirubin Urine: NEGATIVE
Hgb urine dipstick: NEGATIVE
Ketones, ur: NEGATIVE mg/dL
Specific Gravity, Urine: 1.009 (ref 1.005–1.030)
Urobilinogen, UA: 1 mg/dL (ref 0.0–1.0)

## 2011-04-10 LAB — DIFFERENTIAL
Basophils Relative: 1 % (ref 0–1)
Lymphocytes Relative: 18 % (ref 12–46)
Monocytes Absolute: 1 10*3/uL (ref 0.1–1.0)
Monocytes Relative: 11 % (ref 3–12)
Neutro Abs: 6.2 10*3/uL (ref 1.7–7.7)

## 2011-04-10 LAB — POCT I-STAT, CHEM 8
Calcium, Ion: 1.09 mmol/L — ABNORMAL LOW (ref 1.12–1.32)
Creatinine, Ser: 0.8 mg/dL (ref 0.50–1.10)
Glucose, Bld: 102 mg/dL — ABNORMAL HIGH (ref 70–99)
Hemoglobin: 12.2 g/dL (ref 12.0–15.0)
Potassium: 4.1 mEq/L (ref 3.5–5.1)

## 2011-04-10 LAB — COMPREHENSIVE METABOLIC PANEL
ALT: 11 U/L (ref 0–35)
AST: 19 U/L (ref 0–37)
CO2: 30 mEq/L (ref 19–32)
Chloride: 105 mEq/L (ref 96–112)
Creatinine, Ser: 0.83 mg/dL (ref 0.50–1.10)
GFR calc Af Amer: >60 mL/min (ref >60)
GFR calc non Af Amer: >60 mL/min (ref >60)
Glucose, Bld: 102 mg/dL — ABNORMAL HIGH (ref 70–99)
Total Bilirubin: 0.3 mg/dL (ref 0.3–1.2)

## 2011-04-10 LAB — PROTIME-INR: INR: 1.8 — ABNORMAL HIGH (ref 0.00–1.49)

## 2011-04-10 LAB — CBC
HCT: 36 % (ref 36.0–46.0)
Hemoglobin: 11.8 g/dL — ABNORMAL LOW (ref 12.0–15.0)
MCH: 27.4 pg (ref 26.0–34.0)
MCHC: 32.8 g/dL (ref 30.0–36.0)

## 2011-04-10 LAB — CK TOTAL AND CKMB (NOT AT ARMC): Total CK: 64 U/L (ref 7–177)

## 2011-04-10 NOTE — H&P (Incomplete)
Hospital Admission Note Date: 04/10/2011  Patient name: [name] Medical record number: [id number] Date of birth: 04-Mar-1918 Age: 77 y.o. Gender: female PCP: No primary provider on file.  Medical Service:  Attending physician: Dr. Coralee Pesa   Pager: Resident (R2/R3):Dr. Scot Dock   [phone] Resident (R1):Dr. Manson Passey    Pager:[phone]  Chief Complaint:fatigue  History of Present Illness:  PCP: Dr. Rudi Golden, in [place], [place] HPI The patient is a 77 yo woman with a history of paroxysmal afib, LBBB, prior CVA, and atypical chest pain, presenting with a 1-day history of weakness.  The patient awoke this morning with a generalized feeling of "fatigue" and "weakness", with no focal weakness/sensory loss.  Her symptoms continued over the course of the day, prompting her to seek medical attention.  She notes some lightheadedness and sore throat, as well as decreased appetite with possibly decreased PO intake.  She reports chronic nausea, unchanged today from baseline.  She notes no palpitations, shortness of breath, fevers/chills, diarrhea/constipation, blood or melena in stool.  She does note a few-week history of chest pain, described as a mild sternal pain, which occurs 0-3 times/day, is not triggered by exertion, and note relieved by rest.  It is not reproducible, or changed by movement.  She has not noticed the pain much today.  Current Outpatient Prescriptions  Medication Sig Dispense Refill  . Cholecalciferol (VITAMIN D) 2000 UNITS tablet Take 2,000 Units by mouth daily.        . Cyanocobalamin (VITAMIN B-12 IJ) Inject as directed every 30 (thirty) days.        . Diltiazem HCl (DILACOR XR PO) Take by mouth daily.        . furosemide (LASIX) 20 MG tablet Take 20 mg by mouth 2 (two) times daily.              . isosorbide dinitrate (ISORDIL) 10 MG tablet Take 10 mg by mouth 2 (two) times daily.        Marland Kitchen levothyroxine (SYNTHROID, LEVOTHROID) 100 MCG tablet Take 125 mcg by mouth daily.         . meclizine (ANTIVERT) 12.5 MG tablet Take 12.5 mg by mouth as needed.        . metoprolol (LOPRESSOR) 50 MG tablet Take 50 mg by mouth 2 (two) times daily.        . promethazine (PHENERGAN) 25 MG tablet Take 25 mg by mouth. Take half tablet by mouth as needed        Imdur ER 30 mg po qd     Ibuprofen prn    . traMADol (ULTRAM) 50 MG tablet Take 50 mg by mouth 4 (four) times daily as needed.          Allergies: codiene  PMH  1. carotid artery hypersensitivity and syncope, status post Guidant DDD pacemaker.   2.Remote cardiac cath and  low-risk Myoview in 2009 showing an ejection fraction of 70%.   3.stroke in 2007.   4.hypertension.   5.hypothyroidism  6.paroxysmal atrial fibrillation on Coumadin therapy. Guidant Insignia dual-chamber pacemaker implanted February 2007 for carotid sinus hypersensitivity syndrome.  7. peptic ulcer disease with a remote Billroth II procedure.   Past Surgical History  Procedure Date  . Lumbar disc surgery 1991  . Gastric ulcer surgery   . Total abdominal hysterectomy w/ bilateral salpingoophorectomy 10/03    ovarian cysts   . Pacemaker insertion     Appendectomy    Guidant dual lead permanent pacemaker in 2007 secondary to carotid  sinus hypersensitivity (pneumothorax after pacemaker). [employer]     Family history:   Social history: Lives by herself, her son is involved in her care and is at bedside with his wife. Denies smoking, alcohol or drugs. USed to smoke 1-2 cig/day 50 yrs ago. Is pretty independent and ambulatory at home. Takes her medications by herself.   Review of Systems: As per HPI  Physical Exam:  Constitutional: Vital signs reviewed.  Patient is a well-developed and well-nourished white woman in no acute distress and cooperative with exam. Alert and oriented x3.  Head: Normocephalic and atraumatic Ear: TM normal bilaterally Mouth: no erythema or exudates, MMM Eyes: PERRL, EOMI, conjunctivae normal, No scleral icterus.   Neck: Supple, Trachea midline normal ROM, No JVD, mass, thyromegaly, or carotid bruit present.  Cardiovascular: tachycardic, irregularly irregularl, no MRG, pulses symmetric and intact bilaterally. trace pedal edema b/l Pulmonary/Chest: CTAB, no wheezes, rales, or rhonchi Abdominal: Soft. Non-tender, non-distended, bowel sounds are normal, no masses, organomegaly, or guarding present.  GU: no CVA tenderness Musculoskeletal: No joint deformities, erythema, or stiffness, ROM full and no nontender Hematology: no cervical, inginal, or axillary adenopathy.  Neurological: A&O x3, Strenght is normal and symmetric bilaterally, cranial nerve II-XII are grossly intact, no focal motor deficit, sensory intact to light touch bilaterally.  Skin: Warm, dry and intact. No rash, cyanosis, or clubbing.  Psychiatric: Normal mood and affect. speech and behavior is normal. Judgment and thought content normal. Cognition and memory are normal.   Lab results:   WBC                                      9.0               4.0-10.5         K/uL  RBC                                      4.31              3.87-5.11        MIL/uL  Hemoglobin (HGB)                         11.8       l      12.0-15.0        g/dL  Hematocrit (HCT)                         36.0              36.0-46.0        %  MCV                                      83.5              78.0-100.0       fL  MCH -                                    27.4              26.0-34.0  pg  MCHC                                     32.8              30.0-36.0        g/dL  RDW                                      14.6              11.5-15.5        %  Platelet Count (PLT)                     194               150-400          K/uL  Neutrophils, %                           69                43-77            %  Lymphocytes, %                           18                12-46            %  Monocytes, %                             11                3-12             %   Eosinophils, %                           2                 0-5              %  Basophils, %                             1                 0-1              %  Neutrophils, Absolute                    6.2               1.7-7.7          K/uL  Lymphocytes, Absolute                    1.6               0.7-4.0          K/uL  Monocytes, Absolute                      1.0  0.1-1.0          K/uL  Eosinophils, Absolute                    0.2               0.0-0.7          K/uL  Basophils, Absolute                      0.1               0.0-0.1          K/uL  TCO2                                     21                0-100            mmol/L  Ionized Calcium                          1.09       l      1.12-1.32        mmol/L  Hemoglobin (HGB)                         12.2              12.0-15.0        g/dL  Hematocrit (HCT)                         36.0              36.0-46.0        %  Sodium (NA)                              138               135-145          mEq/L  Potassium (K)                            4.1               3.5-5.1          mEq/L  Chloride                                 107               96-112           mEq/L  Glucose                                  102        h      70-99            mg/dL  BUN  21                6-23             mg/dL  Creatinine                               0.80              0.50-1.10        Mg/dL   Color, Urine                             YELLOW            YELLOW  Appearance                               CLEAR             CLEAR  Specific Gravity                         1.009             1.005-1.030  pH                                       7.0               5.0-8.0  Urine Glucose                            NEGATIVE          NEG              mg/dL  Bilirubin                                NEGATIVE          NEG  Ketones                                  NEGATIVE          NEG              mg/dL  Blood                                     NEGATIVE          NEG  Protein                                  NEGATIVE          NEG              mg/dL  Urobilinogen                             1.0               0.0-1.0          mg/dL  Nitrite  NEGATIVE          NEG  Leukocytes                               NEGATIVE          NEG   Creatine Kinase, Total                   73                7-177            U/L  CK, MB                                   3.9               0.3-4.0          Ng/mL  Troponin I                               <0.30             <0.30            ng/mL   Imaging results:   CXR (2view)  IMPRESSION:   1.  Cardiomegaly without pulmonary edema.   2.  Bronchitic changes.  CT head (non contrast)    IMPRESSION:    1.  Cerebral atrophy without acute intracranial abnormality.   2.  Remote left occipital infarc Other results:  Assessment & Plan by Problem:  77 y/o w with pmh of paroxysmal a-fib on coumadin, pacemaker for carotid hypersensitivity, hypothyroidism, b12 def anemia comes to the ED via EMS for fatigue and chest discomfort for past 2 days. Vitals, initial labs and physical exam are unremarkable. Imaging studies do not reveal any acute cardio respiratory event or stroke.   1. Fatigue: The differential diagnoses is very broad at this time. Atrial fibrillation and coronary event are foremost concerns given her extensive cardiac history. Stroke in unlikely given negative neurological exam and CT scan.  - Will check TSH, HbA1C, anemia panel and hem occult.  - Will get PT/OT consult.  - She does not seem dehydrated at this time so will continue her home dose of lasix.  - Social and environmental issues her also important consideration in geriatric patients with sudden onset of not feeling well. She does not have any major changes in her living situation and dietary habits. Will get social worker involved at later daty if felt necessary for placement.  -  She denies  bowel or bladder dysfunction lately and UA is negative of UTI - Will continue her home medications as none of them have been changed recently and is unlikely to cause the sudden onset of fatigue.   2. Chest discomfort: History is atypical but given extensive cardiac problems, will cycle cardiac enzymes and EKG. She had an Echocardiogram 4 yrs ago that showed EF of 50-55% without any other major abnormality. Will not repeat it at this time as she does not seem to be in any overt heart failure.   3. Atrial fibrillation: Rate controlled with metoprolol and cardizem. BP stable. Pacemaker interrogated by technician and seems to be working fine otherwise. Coumadin to be dosed per pharmacy.   4. Hypothyroidism: Check TSH. Continue synthroid at current dose.   5.  Anemia- seems to be at baseline. Check Anemia panel.   6. DVT Px: lovenox

## 2011-04-11 DIAGNOSIS — I359 Nonrheumatic aortic valve disorder, unspecified: Secondary | ICD-10-CM

## 2011-04-11 DIAGNOSIS — R0989 Other specified symptoms and signs involving the circulatory and respiratory systems: Secondary | ICD-10-CM

## 2011-04-11 DIAGNOSIS — R079 Chest pain, unspecified: Secondary | ICD-10-CM

## 2011-04-11 DIAGNOSIS — I200 Unstable angina: Secondary | ICD-10-CM

## 2011-04-11 LAB — CBC
Platelets: 168 10*3/uL (ref 150–400)
RDW: 14.5 % (ref 11.5–15.5)
WBC: 7.3 10*3/uL (ref 4.0–10.5)

## 2011-04-11 LAB — IRON AND TIBC
Saturation Ratios: 10 % — ABNORMAL LOW (ref 20–55)
TIBC: 408 ug/dL (ref 250–470)
UIBC: 368 ug/dL

## 2011-04-11 LAB — CARDIAC PANEL(CRET KIN+CKTOT+MB+TROPI)
CK, MB: 3.7 ng/mL (ref 0.3–4.0)
CK, MB: 3.7 ng/mL (ref 0.3–4.0)
Relative Index: INVALID (ref 0.0–2.5)
Total CK: 66 U/L (ref 7–177)
Troponin I: <0.3 ng/mL (ref <0.30)
Troponin I: <0.3 ng/mL (ref <0.30)

## 2011-04-11 LAB — LIPID PANEL
Cholesterol: 140 mg/dL (ref 0–200)
Triglycerides: 94 mg/dL (ref <150)

## 2011-04-11 LAB — HEMOGLOBIN A1C
Hgb A1c MFr Bld: 6.6 % — ABNORMAL HIGH (ref <5.7)
Mean Plasma Glucose: 143 mg/dL — ABNORMAL HIGH (ref <117)

## 2011-04-12 DIAGNOSIS — R079 Chest pain, unspecified: Secondary | ICD-10-CM

## 2011-04-12 DIAGNOSIS — I200 Unstable angina: Secondary | ICD-10-CM

## 2011-04-12 LAB — MAGNESIUM: Magnesium: 2 mg/dL (ref 1.5–2.5)

## 2011-04-12 LAB — CBC
Hemoglobin: 11.7 g/dL — ABNORMAL LOW (ref 12.0–15.0)
MCHC: 32.1 g/dL (ref 30.0–36.0)
RBC: 4.36 MIL/uL (ref 3.87–5.11)
WBC: 6.5 10*3/uL (ref 4.0–10.5)

## 2011-04-12 LAB — BASIC METABOLIC PANEL
Chloride: 103 mEq/L (ref 96–112)
Creatinine, Ser: 0.67 mg/dL (ref 0.50–1.10)
GFR calc Af Amer: >60 mL/min (ref >60)
Potassium: 3.8 mEq/L (ref 3.5–5.1)

## 2011-04-12 LAB — PROTIME-INR
INR: 1.84 — ABNORMAL HIGH (ref 0.00–1.49)
Prothrombin Time: 21.6 seconds — ABNORMAL HIGH (ref 11.6–15.2)

## 2011-04-16 ENCOUNTER — Inpatient Hospital Stay (HOSPITAL_COMMUNITY)
Admission: EM | Admit: 2011-04-16 | Discharge: 2011-04-19 | DRG: 308 | Disposition: A | Discharge status: Home / Self Care | Payer: Medicare Other | Attending: Internal Medicine | Admitting: Internal Medicine

## 2011-04-16 ENCOUNTER — Encounter: Payer: Self-pay | Admitting: Internal Medicine

## 2011-04-16 ENCOUNTER — Emergency Department (HOSPITAL_COMMUNITY): Payer: Medicare Other

## 2011-04-16 DIAGNOSIS — E039 Hypothyroidism, unspecified: Secondary | ICD-10-CM | POA: Diagnosis present

## 2011-04-16 DIAGNOSIS — D509 Iron deficiency anemia, unspecified: Secondary | ICD-10-CM | POA: Diagnosis present

## 2011-04-16 DIAGNOSIS — Z882 Allergy status to sulfonamides status: Secondary | ICD-10-CM

## 2011-04-16 DIAGNOSIS — Z888 Allergy status to other drugs, medicaments and biological substances status: Secondary | ICD-10-CM

## 2011-04-16 DIAGNOSIS — I509 Heart failure, unspecified: Secondary | ICD-10-CM | POA: Diagnosis present

## 2011-04-16 DIAGNOSIS — I5033 Acute on chronic diastolic (congestive) heart failure: Secondary | ICD-10-CM | POA: Diagnosis not present

## 2011-04-16 DIAGNOSIS — I10 Essential (primary) hypertension: Secondary | ICD-10-CM | POA: Diagnosis present

## 2011-04-16 DIAGNOSIS — I4891 Unspecified atrial fibrillation: Secondary | ICD-10-CM

## 2011-04-16 DIAGNOSIS — R079 Chest pain, unspecified: Secondary | ICD-10-CM

## 2011-04-16 DIAGNOSIS — R0789 Other chest pain: Secondary | ICD-10-CM | POA: Diagnosis present

## 2011-04-16 DIAGNOSIS — Z79899 Other long term (current) drug therapy: Secondary | ICD-10-CM

## 2011-04-16 DIAGNOSIS — Z8673 Personal history of transient ischemic attack (TIA), and cerebral infarction without residual deficits: Secondary | ICD-10-CM

## 2011-04-16 DIAGNOSIS — Z7901 Long term (current) use of anticoagulants: Secondary | ICD-10-CM

## 2011-04-16 DIAGNOSIS — I447 Left bundle-branch block, unspecified: Secondary | ICD-10-CM | POA: Diagnosis present

## 2011-04-16 DIAGNOSIS — Z950 Presence of cardiac pacemaker: Secondary | ICD-10-CM

## 2011-04-16 DIAGNOSIS — Z87891 Personal history of nicotine dependence: Secondary | ICD-10-CM

## 2011-04-16 DIAGNOSIS — R5381 Other malaise: Secondary | ICD-10-CM | POA: Diagnosis present

## 2011-04-16 LAB — DIFFERENTIAL
Basophils Absolute: 0.1 10*3/uL (ref 0.0–0.1)
Basophils Relative: 1 % (ref 0–1)
Eosinophils Absolute: 0.1 10*3/uL (ref 0.0–0.7)
Eosinophils Relative: 1 % (ref 0–5)
Lymphocytes Relative: 19 % (ref 12–46)
Lymphs Abs: 2 10*3/uL (ref 0.7–4.0)
Monocytes Absolute: 0.8 10*3/uL (ref 0.1–1.0)
Monocytes Relative: 8 % (ref 3–12)
Neutro Abs: 7.4 10*3/uL (ref 1.7–7.7)
Neutrophils Relative %: 71 % (ref 43–77)

## 2011-04-16 LAB — CBC
Hemoglobin: 11.4 g/dL — ABNORMAL LOW (ref 12.0–15.0)
MCH: 27.2 pg (ref 26.0–34.0)
MCHC: 32.2 g/dL (ref 30.0–36.0)
MCV: 84.5 fL (ref 78.0–100.0)
RBC: 4.19 MIL/uL (ref 3.87–5.11)

## 2011-04-16 LAB — BASIC METABOLIC PANEL
BUN: 25 mg/dL — ABNORMAL HIGH (ref 6–23)
CO2: 25 mEq/L (ref 19–32)
Chloride: 103 mEq/L (ref 96–112)
Creatinine, Ser: 0.94 mg/dL (ref 0.50–1.10)
Glucose, Bld: 115 mg/dL — ABNORMAL HIGH (ref 70–99)
Potassium: 4.6 mEq/L (ref 3.5–5.1)

## 2011-04-16 LAB — POCT I-STAT TROPONIN I: Troponin i, poc: 0.05 ng/mL (ref 0.00–0.08)

## 2011-04-16 LAB — CK TOTAL AND CKMB (NOT AT ARMC)
CK, MB: 3.6 ng/mL (ref 0.3–4.0)
Relative Index: INVALID (ref 0.0–2.5)
Total CK: 58 U/L (ref 7–177)

## 2011-04-16 LAB — APTT: aPTT: 47 seconds — ABNORMAL HIGH (ref 24–37)

## 2011-04-16 NOTE — H&P (Addendum)
Hospital Admission Note Date: 04/16/2011  Patient name: [name] Medical record number: [id number] Date of birth: [birth year] Age: 77 y.o. Gender: female PCP: Dr. [name] Medical Service: General Medicine Teaching Service B2  Attending physician:   Dr. Tilford Pillar     Resident [phone]):   Dr. Bethel Born  Pager:  [phone] Resident (R1):   Dr. Blanca Friend  Pager:  [phone]  Chief Complaint: Chest Pain, Weakness, SOB  History of Present Illness: [name] is a 77 year old woman with a history of paroxysmal afib, LBBB, implanted dual chamber pacemaker, prior CVA, and atypical chest pain who presents after experiencing chest pain and shortness of breath this morning.  She went to her primary care physician, who found atrial fibrillation with RVR on EKG and sent her to the ED.  She presented to the hospital and was admitted to our service on 04/10/11 with fatigue and found to be in atrial fibrillation.  During hospitalization her atrial fibrillation largely resolved, and she experienced only two episodes within the last 24 hours of hospitalization, each only lasting 1-3 seconds.  Echo performed during admission and found mild left ventricular hypertrophy, mild left atrium dilation, ejection fraction 55-60%, mild stenosis of the aortic valve.  She felt much better when she went home, and continued to improve through yesterday evening.  Her family does note that she did not eat a lot for lunch/dinner yesterday.  However, this morning she developed this chest pain, located centrally in her chest and last two hours.  She has a very difficult time catching her breath, and she felt nauseated.  She also felt some faint palpitations.  These symptoms are why she went to her PCP Dr. Christell Constant.  She also reports a couple loose stools last night, but she has occasional loose stools at baseline.  No vomiting, fever, chills, dysuria, hematuria, constipation, abdominal pain, numbness, tingling, speech  changes, or facial droop.  Meds: DILTIAZEM CD/XT 360 MG, PO, Dose: 1 cap DAILY FUROSEMIDE 20 MG, PO, Dose: 1 TAB, DAILY, PRN  ISOSORBIDE DINITRATE 30 MG, PO, Dose: 1 TAB, DAILY  LEVOXYL (LEVOTHYROXINE) 100 MCG, PO, Dose: 1 Tab, daily M-F only LEVOXYL (LEVOTHYROXINE) 25 MCG, PO, Dose: 1 Tab, daily Sat and Sunday only MECLIZINE 12.5 MG, PO, Dose: 1 Tab, qam METOPROLOL TARTRATE 50 MG, PO, Dose: 1 Tab, BID  NITROGLYCERIN SL 0.4 MG, SL, Dose: 1 Tab, [id number], PRN  PROMETHAZINE 25 MG, PO, Dose: 1 Tab, q6h, PRN  TRAMADOL 50 MG, PO, Dose: 1 Tab, QID, PRN  TYLENOL EXTRA STRENGTH (ACETAMINOPHEN) 500 MG, PO, Dose: 2 Tab, q6h, PRN VITAMIN B-12 (CYANOCOBALAMIN) OTC, PO, Dose: 1 Tab, Daily  VITAMIN D3 2000 UNITS, PO, Dose: 1 Tab, qpm WARFARIN 3 MG, PO, Dose: 1 - 2 Tab, Daily  Allergies: Diphenhydramine hcl Codeine Tramadol/Tylenol Sulfa Risendronate   PMH  1. carotid artery hypersensitivity and syncope, status post Guidant DDD pacemaker.  2.Remote cardiac cath and low-risk Myoview in 2009 showing an ejection fraction of 70%. Echo 04/11/11 mild left ventricular hypertrophy, mild left atrium dilation, ejection fraction 55-60%, mild stenosis of the aortic valve 3.stroke in 2007.  4.hypertension.  5.hypothyroidism  6.paroxysmal atrial fibrillation on Coumadin therapy. Guidant Insignia dual-chamber pacemaker implanted February 2007 for carotid sinus hypersensitivity syndrome.  7. peptic ulcer disease with a remote Billroth II procedure.   Past Surgical History   Procedure  Date   .  Lumbar disc surgery  1991   .  Gastric ulcer surgery    .  Total abdominal hysterectomy w/ bilateral salpingoophorectomy  10/03     ovarian cysts   .  Pacemaker insertion      Appendectomy     Guidant dual lead permanent pacemaker in 2007 secondary to carotid sinus hypersensitivity (pneumothorax after pacemaker). [employer]    :  Social history: Lives by herself, her son is involved in her care and is at  bedside with his wife. Denies smoking, alcohol or drugs. Used to smoke 1-2 cig/day 50 yrs ago. Is pretty independent and ambulatory at home. Takes her medications by herself.   Review of Systems:  As per HPI   Physical Exam:  Constitutional: Vital signs reviewed. Patient is a well-developed and well-nourished white woman in no acute distress and cooperative with exam. Alert and oriented x3.  Head: Normocephalic and atraumatic  Mouth: no erythema or exudates. Eyes: PERRL, EOMI, conjunctivae normal, No scleral icterus.  Neck: Supple, Trachea midline normal ROM, No JVD, mass, thyromegaly, or carotid bruit present.  Cardiovascular: regular rate, regular rhythm (during exam monitor shows all beats are paced beats), 2/6 ejection murmur over aortic area, pulses symmetric and intact bilaterally. No pedal edema Pulmonary/Chest: CTAB, no wheezes, rales, or rhonchi  Abdominal: Soft. Non-tender, non-distended, bowel sounds are normal, no guarding present.  Musculoskeletal: No joint deformities, erythema, or stiffness, ROM full and no nontender Hematology: no cervical, inginal, or axillary adenopathy.  Neurological: A&O x3, Strength is normal and symmetric bilaterally, cranial nerve II-XII are grossly intact, no focal motor deficit, sensory intact to light touch bilaterally.  Skin: Warm, dry and intact. No rash, cyanosis, or clubbing.  Psychiatric: Normal mood and affect. speech and behavior is normal. Judgment and thought content normal. Cognition and memory are normal.   Lab results:  Admission on 04/16/2011  Component Date Value Range Status  . Neutrophils Relative (%) 04/16/2011 71  43-77 Final  . Neutro Abs (K/uL) 04/16/2011 7.4  1.7-7.7 Final  . Lymphocytes Relative (%) 04/16/2011 19  12-46 Final  . Lymphs Abs (K/uL) 04/16/2011 2.0  0.7-4.0 Final  . Monocytes Relative (%) 04/16/2011 8  3-12 Final  . Monocytes Absolute (K/uL) 04/16/2011 0.8  0.1-1.0 Final  . Eosinophils Relative (%) 04/16/2011 1   0-5 Final  . Eosinophils Absolute (K/uL) 04/16/2011 0.1  0.0-0.7 Final  . Basophils Relative (%) 04/16/2011 1  0-1 Final  . Basophils Absolute (K/uL) 04/16/2011 0.1  0.0-0.1 Final  . WBC (K/uL) 04/16/2011 10.4  4.0-10.5 Final  . RBC (MIL/uL) 04/16/2011 4.19  3.87-5.11 Final  . Hemoglobin (g/dL) 16/06/9603 54.0* 98.1-19.1 Final  . HCT (%) 04/16/2011 35.4* 36.0-46.0 Final  . MCV (fL) 04/16/2011 84.5  78.0-100.0 Final  . MCH (pg) 04/16/2011 27.2  26.0-34.0 Final  . MCHC (g/dL) 47/82/9562 13.0  86.5-78.4 Final  . RDW (%) 04/16/2011 14.9  11.5-15.5 Final  . Platelets (K/uL) 04/16/2011 190  150-400 Final  . Troponin i, poc (ng/mL) 04/16/2011 0.05  0.00-0.08 Final  . Comment 3  04/16/2011          Final   Comment: Due to the release kinetics of cTnI,                          a negative result within the first hours                          of the onset of symptoms does not rule out  myocardial infarction with certainty.                          If myocardial infarction is still suspected,                          repeat the test at appropriate intervals.  . Sodium (mEq/L) 04/16/2011 137  135-145 Final  . Potassium (mEq/L) 04/16/2011 4.6  3.5-5.1 Final  . Chloride (mEq/L) 04/16/2011 103  96-112 Final  . CO2 (mEq/L) 04/16/2011 25  19-32 Final  . Glucose, Bld (mg/dL) 16/06/9603 540* 98-11 Final  . BUN (mg/dL) 91/47/8295 25* 6-21 Final  . Creatinine, Ser (mg/dL) 30/86/5784 6.96  2.95-2.84 Final  . Calcium (mg/dL) 13/24/4010 9.1  2.7-25.3 Final  . GFR calc non Af Amer (mL/min) 04/16/2011 56* >60 Final  . GFR calc Af Amer (mL/min) 04/16/2011 >60  >60 Final   Comment:                                 The eGFR has been calculated                          using the MDRD equation.                          This calculation has not been                          validated in all clinical                          situations.                          eGFR's persistently                           <60 mL/min signify                          possible Chronic Kidney Disease.  Marland Kitchen Prothrombin Time (seconds) 04/16/2011 28.0* 11.6-15.2 Final  . INR  04/16/2011 2.57* 0.00-1.49 Final  . Total CK (U/L) 04/16/2011 58  7-177 Final  . CK, MB (ng/mL) 04/16/2011 3.6  0.3-4.0 Final  . Relative Index  04/16/2011 RELATIVE INDEX IS INVALID  0.0-2.5 Final   Comment: WHEN CK < 100 U/L                                    Other Data: EKG:  EKG on arrival to ED: Atrial fibrillation. Rate ~100.  Left bundle branch block.  Some beats are paced, others are not.     Assessment & Plan by Problem:  77 y/o w with pmh of paroxysmal a-fib on coumadin, s/p pacemaker insertion, hypothyroidism, and anemia comes to the ED after presenting with afib with RVR to primary care physician today.   1. Atrial Fibrillation: It appears that Ms. Knippenberg has converted back to afib since discharge.  She has remained in atrial fibrillation during her time in the ED.  She is rate controlled at home with metoprolol  and cardizem.  BP is stable.  Pacemaker appears to be working, and it was interrogated by a Pensions consultant during previous hospitalization.    Continue rate controlled when examined with metoprolol and cardizem.  Admit to telemetry, heart healthy diet Saline lock IV Start Amiodarone per cardiology recommendation Urinalysis: No urinary symptoms, but evaluate CMET Repeat EKG in the morning Coumadin dosed per pharmacy  2. Chest discomfort: History is atypical but given extensive cardiac problems, will cycle cardiac enzymes and EKG. She had an Echocardiogram 4 yrs ago that showed EF of 50-55% without any other major abnormality. Will not repeat it at this time as she does not seem to be in any overt heart failure.   Cardiac Enzyme x3 CMET PT/INR, pTT ASA 325mg  daily Nitroglycerin PRN  3. Hypothyroidism: TSH normal during recent hospitalization.    Continue home synthroid regimen.  4. Anemia- Anemia  panel during recent hospitalization was indicative of iron deficiency anemia.  Hgb 11.7 on discharge four days ago, 11.4 today.    Consider restarting iron therapy once patient stabilized.    5. DVT Px: Warfarin      R2/3 ______________________________    R1________________________________    ATTENDING: I performed and/or observed a history and physical examination of the patient.  I discussed the case with the residents as noted and reviewed the residents' notes.  I agree with the findings and plan--please refer to the attending physician note for more details.  Signature________________________________  Printed Name_____________________________

## 2011-04-17 LAB — PROTIME-INR: Prothrombin Time: 22 seconds — ABNORMAL HIGH (ref 11.6–15.2)

## 2011-04-17 LAB — COMPREHENSIVE METABOLIC PANEL
ALT: 20 U/L (ref 0–35)
BUN: 21 mg/dL (ref 6–23)
CO2: 26 mEq/L (ref 19–32)
Calcium: 9.1 mg/dL (ref 8.4–10.5)
GFR calc Af Amer: >60 mL/min (ref >60)
GFR calc non Af Amer: >60 mL/min (ref >60)
Glucose, Bld: 102 mg/dL — ABNORMAL HIGH (ref 70–99)
Sodium: 136 mEq/L (ref 135–145)
Total Protein: 5.9 g/dL — ABNORMAL LOW (ref 6.0–8.3)

## 2011-04-17 LAB — CARDIAC PANEL(CRET KIN+CKTOT+MB+TROPI)
CK, MB: 3.8 ng/mL (ref 0.3–4.0)
CK, MB: 4.4 ng/mL — ABNORMAL HIGH (ref 0.3–4.0)
Total CK: 62 U/L (ref 7–177)
Total CK: 70 U/L (ref 7–177)

## 2011-04-17 LAB — PRO B NATRIURETIC PEPTIDE: Pro B Natriuretic peptide (BNP): 5439 pg/mL — ABNORMAL HIGH (ref 0–450)

## 2011-04-18 LAB — BASIC METABOLIC PANEL
CO2: 26 mEq/L (ref 19–32)
Chloride: 103 mEq/L (ref 96–112)
Glucose, Bld: 95 mg/dL (ref 70–99)
Sodium: 140 mEq/L (ref 135–145)

## 2011-04-18 LAB — PRO B NATRIURETIC PEPTIDE: Pro B Natriuretic peptide (BNP): 2623 pg/mL — ABNORMAL HIGH (ref 0–450)

## 2011-04-18 LAB — CBC
HCT: 36.7 % (ref 36.0–46.0)
Hemoglobin: 12 g/dL (ref 12.0–15.0)
MCHC: 32.7 g/dL (ref 30.0–36.0)
MCV: 83.4 fL (ref 78.0–100.0)
WBC: 8.3 10*3/uL (ref 4.0–10.5)

## 2011-04-18 LAB — PROTIME-INR: INR: 1.76 — ABNORMAL HIGH (ref 0.00–1.49)

## 2011-04-18 NOTE — Consult Note (Signed)
NAME:  BRANDIS, WIXTED.:  [id number]  MEDICAL RECORD NO.:  [id number]  LOCATION:  3708                         FACILITY:  [facility]  PHYSICIAN:  Luis Abed, MD, FACCDATE OF BIRTH:  April 05, 1918  DATE OF CONSULTATION:  04/16/2011 DATE OF DISCHARGE:                                CONSULTATION   From the Cardiology viewpoint, the patient is followed by Dr. Hillis Range of [facility].  The patient recently was hospitalized. She went home and has come back several days later.  During her recent hospitalization on April 09, 2001, the patient had some atrial fibrillation.  She also had weakness and chest pain.  Her atrial fibrillation converted to sinus.  She improved over time while in the hospital.  She was then discharged home.  She returns today feeling poorly.  The history is difficult.  The components remain the same as her last admission including fatigue, chest discomfort, and palpitations.  It is very difficult to know what the primary issue is.  This morning, she got up and felt weak.  She has had some palpitations.  She came back and she does have atrial fibrillation.  ALLERGIES:  No known drug allergies.  MEDICATIONS:  The patient went home on: 1. Diltiazem long acting 360 mg. 2. Furosemide 20. 3. Imdur 10 b.i.d. 4. Thyroid 120 mcg. 5. Meclizine. 6. Metoprolol 50 b.i.d. 7. Nitroglycerin p.r.n. 8. Vitamin D and Coumadin as directed.  OTHER MEDICAL PROBLEMS:  See the complete list below.  SOCIAL HISTORY:  The patient lives by herself in [place].  She does not work.  She quit smoking 50 years ago.  She is elderly and retired.  She has an attentive family.  FAMILY HISTORY:  Her father died of coronary artery disease at 38.  REVIEW OF SYSTEMS:  The patient denies fever, chills, headache, sweats, rash, change in vision, change in hearing, nausea, vomiting, urinary symptoms.  All other systems are reviewed and are negative.  PHYSICAL  EXAMINATION:  VITAL SIGNS:  Currently, the blood pressure is 130/80 and the pulse is 84. GENERAL:  The patient is oriented to person, time, and place.  Affect is normal.  Her son and daughter-in-law are in the room. HEENT:  Head is atraumatic.  There is no jugular venous distention. LUNGS:  Revealed a few scattered rhonchi.  There is no respiratory distress. CARDIAC:  Reveals an S1 and S2.  The rhythm is irregularly irregular. There is a systolic murmur. ABDOMEN:  Soft. EXTREMITIES:  There is trace peripheral edema.  EKG reveals atrial fibrillation with a controlled rate and intermittent pacing.  Hemoglobin is 11.4, potassium is 4.6.  BUN is 25 and creatinine is 0.94.  There are no cardiac enzymes available yet.  Chest x-ray shows no acute cardiopulmonary disease.  PROBLEMS: 1. History of permanent pacemaker. 2. Normal ejection fraction.  EF is 55-60%.  This is by echo done in     the last week. 3. Mild aortic stenosis by echo done in the last week. 4. Old left bundle-branch block. 5. Fatigue.  Exact etiology is not clear but it may be that she     becomes quite fatigued when she  has burst of atrial fibrillation. 6. Chest pain.  Once again, the etiology is not clear.  She has not     had a recent aggressive ischemic workup.  However, her enzymes have     been normal.  She may feel some discomfort related to atrial     fibrillation. 7. Paroxysmal atrial fibrillation.  It is possible that she is very     symptomatic from her atrial fibrillation.  I cannot document the     fast rates but all of the data is not available to me.  Considering all the issues, it seems that trying to see if she can be stabilized by maintaining sinus rhythm is one good approach.  This may not fix all of her problems.  I would recommend starting amiodarone at 400 mg p.o. b.i.d.  She can then be followed in the hospital as she is loaded with amiodarone to see how she tolerates the meds and to see how she  does with other issues concerning her chest discomfort and fatigue.     Luis Abed, MD, [facility]     JDK/MEDQ  D:  04/16/2011  T:  04/17/2011  Job:  [id number]  Electronically Signed by Willa Rough MD FACC on 04/18/2011 04:59:55 PM

## 2011-04-18 NOTE — Discharge Summary (Signed)
NAME:  [name], DARCEY NO.:  [id number]  MEDICAL RECORD NO.:  [id number]  LOCATION:  2013                         FACILITY:  [facility]  PHYSICIAN:  Tilford Pillar, MD     DATE OF BIRTH:  1918-05-26  DATE OF ADMISSION:  04/10/2011 DATE OF DISCHARGE:  04/12/2011                              DISCHARGE SUMMARY   DISCHARGE DIAGNOSES: 1. Atrial fibrillation. 2. Chest pain. 3. Hypothyroidism. 4. Anemia.  DISCHARGE MEDICATIONS: 1. Diltiazem CD/XT 360 mg 1 capsule by mouth daily. 2. Furosemide 20 mg 1 capsule by mouth daily as needed. 3. Isosorbide dinitrate 10 mg 1 tablet by mouth twice daily. 4. Levothyroxine 100 mcg monday-friday, 25 mcg saturday-sunday 5. Meclizine 12.5 mg 1 tablet by mouth daily as needed. 6. Metoprolol tartrate 50 mg 1 tablet by mouth twice daily. 7. Nitroglycerin sublingual 0.4 mg 1 tablet under tongue every 5     minutes as needed up to three doses. 8. Promethazine 25 mg 1 tablet by mouth every 6 hours as needed for     nausea and vomiting. 9. Tramadol 50 mg 1 tablet by mouth four times daily as needed for     pain. 10.Tylenol Extra Strength 500 mg 2 tablets by mouth every 6 hours as     needed for pain. 11.Vitamin B12 over-the-counter 1 tablet by mouth daily. 12.Vitamin D3 2000 units 1 tablet by mouth every evening. 13.Warfarin 3 mg 2 tablets every morning, Sunday, Monday, Wednesday,     Thursday, Friday and Saturday, Tuesday 1 tablet by mouth.  The patient was discharged home from [facility] with resolution of weakness and fatigue.  The patient is to follow up with Dr. [name] within the next month.  The patient is also to follow up with Dr. Graciela Husbands of [facility] on April 19, 2011, for continued management of atrial fibrillation.  PROCEDURES PERFORMED:  CT of the head April 10, 2011, no intracranial abnormality found.  Chest x-ray April 10, 2011, cardiomegaly without pulmonary edema, no acute process.   Echocardiogram April 11, 2011, mild left ventricular hypertrophy, mild left atrium dilation, ejection fraction 55-60%, mild stenosis of the aortic valve.  EKG April 11, 2011, paced rhythm.  No ST changes.  Carotid Dopplers:  No ICA stenosis.  CONSULTATIONS:  Cardiology.  ADMITTING HISTORY AND PHYSICAL:  The patient is a 77 year old woman with a history of paroxysmal AFib, left bundle-branch block, prior CVA and atypical chest pain presenting with a 1 day history of weakness.  The patient awoke this morning with a generalized feeling of fatigue and weakness with no focal weaknesses/sensory loss.  Her symptoms continued over the course of the day prompting her to seek medical attention.  She noted some lightheadedness and sore throat as well as decreased appetite with possibly decreased p.o. intake.  She reports chronic nausea unchanged state from baseline.  She notes no palpitations, shortness of breath, fevers, chills, diarrhea, constipation, blood or melanotic stool.  She does note a few-week history of chest pain described as a mild sternal pain which occurred three times a day, is not triggered by exertion and is not relieved by rest.  It is not reproducible or changed  by movement.  She has not noticed the pain much today.  PHYSICAL EXAMINATION:  VITAL SIGNS:  Temperature 97.6, blood pressure 104/61, pulse 89, respirations 18, oxygen saturation 96% on room air. GENERAL:  Alert, awake, but appears fatigued, can answer questions slowly. HEENT:  Pupils equal, round and reactive to light.  Extraocular movements intact.  Oropharynx clear and nonerythematous. CARDIOVASCULAR:  Tachycardic, irregularly irregular heartbeat.  No murmurs, gallops or rubs. PULMONARY:  Clear to auscultation bilaterally.  No wheezes, rales or rhonchi. ABDOMEN:  Soft, nontender, nondistended.  Bowel sounds present. EXTREMITIES:  Trace nonpitting edema noted in bilateral lower extremities. NEURO:  Alert and  oriented x3.  Cranial nerves II-XII intact.  Strength 5/5 throughout.  Sensation intact to pinprick throughout.  LABORATORY DATA:  WBC 9.0, hemoglobin 11.8, hematocrit 36.0, platelets 194.  INR 1.8.  PT 21.2.  Sodium 141, potassium 4.1, chloride 105, bicarb 30, BUN 18, creatinine 0.83, glucose 102.  Hemoglobin A1c 6.6. Troponin less than 0.3 x3 reading.  CK-MB negative x3 reading.  LDL 70, HDL 51, TSH 0.848.  Ferritin 15, iron 40, total iron binding capacity 408.  Urinalysis normal.  HOSPITAL COURSE BY PROBLEM: 1. Fatigue.  The patient presented with a 1 day history of fatigue     which she reports was 80% better on day 2 of admission and had     clearly resolved by day 3 of admission.  The patient did present in     AFib as noted on her EKG which subsequently resolved.  It was also     found that her pacemaker settings were likely under detecting the     amount of atrial fibrillation the patient was having and these     settings were adjusted.  A CT of the head, TSH, and hemoglobin A1c     were unremarkable as to causes of her fatigue, some iron-deficiency     anemia was noted and the patient was started on iron     supplementation.  Echocardiogram showed an ejection fraction of 55-     60%. 2. Chest pain.  The patient notes a few week history of nonexertional     chest pain not relieved by rest with troponin and CK-MB negative     and no ST changes seen on EKG.  Pain is unlikely cardiac in origin     and may represent nonspecific gastritis versus musculoskeletal     pain. 3. Atrial fibrillation.  The patient's atrial fibrillation resolved     shortly after admission.  The patient was continued on telemetry     during hospitalization and on the day of discharge it was noted     that she only had two episodes of atrial fibrillation in the last     24 hours lasting approximately 1-3 seconds each. 4. Anemia.  The patient was found to have low normal ferritin and low     iron with a  high total iron binding capacity suggesting iron     deficiency anemia.  The patient was started on iron     supplementations in hospital and will follow up with her PCP. 5. Hypothyroidism.  The patient's TSH was checked and was normal.  DISCHARGE LABS AND VITALS:  Temperature 97.4, blood pressure 120/64, pulse 61, respirations 20, oxygen saturation 98% on room air.  WBC 6.5, hemoglobin 11.7, hematocrit 36.5, platelets 198.  Sodium 141, potassium 3.8, chloride 103, bicarb 29, BUN 15, creatinine 0.67, glucose 97, magnesium 2.0, INR 1.84.  PT  21.6.    ______________________________ Janalyn Harder, MD   ______________________________ Tilford Pillar, MD    RB/MEDQ  D:  04/12/2011  T:  04/13/2011  Job:  [id number]  cc:   Ernestina Penna, M.D.  Electronically Signed by Janalyn Harder MD on 04/18/2011 08:56:54 AM Electronically Signed by Tilford Pillar  on 04/18/2011 12:52:59 PM

## 2011-04-19 ENCOUNTER — Encounter: Payer: Medicare Other | Admitting: Internal Medicine

## 2011-04-19 LAB — CBC
HCT: 37.1 % (ref 36.0–46.0)
Hemoglobin: 12.2 g/dL (ref 12.0–15.0)
MCV: 83.2 fL (ref 78.0–100.0)
WBC: 8.5 10*3/uL (ref 4.0–10.5)

## 2011-04-19 LAB — BASIC METABOLIC PANEL
Chloride: 100 mEq/L (ref 96–112)
Creatinine, Ser: 0.97 mg/dL (ref 0.50–1.10)
GFR calc Af Amer: >60 mL/min (ref >60)
Potassium: 3.5 mEq/L (ref 3.5–5.1)
Sodium: 138 mEq/L (ref 135–145)

## 2011-04-19 LAB — PRO B NATRIURETIC PEPTIDE: Pro B Natriuretic peptide (BNP): 1402 pg/mL — ABNORMAL HIGH (ref 0–450)

## 2011-04-19 LAB — PROTIME-INR: INR: 1.9 — ABNORMAL HIGH (ref 0.00–1.49)

## 2011-04-19 LAB — MAGNESIUM: Magnesium: 2 mg/dL (ref 1.5–2.5)

## 2011-04-19 NOTE — Consult Note (Signed)
NAME:  [name], [name].:  [id number]  MEDICAL RECORD NO.:  [id number]  LOCATION:  2013                         FACILITY:  [facility]  PHYSICIAN:  Cassell Clement, M.D. DATE OF BIRTH:  June 17, 1918  DATE OF CONSULTATION:  04/11/2011 DATE OF DISCHARGE:                                CONSULTATION   PRIMARY CARDIOLOGIST:  Jesse Sans. Daleen Squibb, MD, [facility]  ELECTROPHYSIOLOGIST:  Hillis Range, MD  PRIMARY CARE PROVIDER:  Ernestina Penna, MD  PATIENT PROFILE:  A 77 year old female with prior history of paroxysmal atrial fibrillation as well as carotid sinus sensitivity status post permanent pacemaker placement presented to the ER yesterday with complaints of weakness and was found on device interrogation to have recurrent paroxysmal atrial fibrillation.  PROBLEMS: 1. Weakness. 2. Paroxysmal atrial fibrillation.     a.     On chronic Coumadin.     b.     A 2-D echocardiogram April 03, 2007 showing an EF of 50-55%.3. History of atypical chest pain.     a.     Remote normal catheterization.     b.     History of negative stress test in 2009. 4. Carotid sinus sensitivity/syncope.     a.     Status post Guidant Insignia dual-chamber permanent      pacemaker.         I. Pneumothorax after placement. 5. Chronic left bundle branch block. 6. Hypertension. 7. History of CVA in 2007. 8. Hypothyroidism. 9. Migraine headaches. 10.B12 deficiency anemia. 11.Osteoarthritis. 12.History of cataracts. 13.History of gastritis. 14.History of endometrial polyps.  ALLERGIES:  ACTONEL, TYLENOL, BENADRYL, CODEINE, ULTRACET.  HISTORY OF PRESENT ILLNESS:  A 77 year old female with history of paroxysmal atrial fibrillation and syncope secondary to carotid sinus sensitivity status post pacemaker placement.  The patient was in usual state of health until yesterday morning when she awoke and felt profoundly weak with mild dyspnea and throat discomfort.  She presented to the [facility] ED where  her initial documented heart rate was 109. Device was interrogated and showed atrial fibrillation with atrial undersensing.  Therefore atrial sensing was reduced from 0.5 mV to 0.15 mV.  She was subsequently admitted and has ruled out for MI.  On telemetry, she has predominately AV paced with some sinus rhythm and rates into the 80s.  She has also had occasional brief paroxysm of a more rapid rate appearing to be atrial fibrillation lasting just a few seconds and resolving spontaneously.  This morning she continues to feel slightly weak, although overall a little bit better.  CURRENT MEDICATIONS: 1. Vitamin D 2000 units at bedtime. 2. Diltiazem 360 mg daily. 3. Isordil 10 mg b.i.d. 4. Synthroid 125 mcg daily. 5. Meclizine 12.5 mg daily. 6. Lopressor 50 mg b.i.d. 7 . Coumadin as directed.  FAMILY HISTORY:  Mother died at 79 of coronary disease.  Father died at 64 of coronary disease.  She has sister with coronary disease.  SOCIAL HISTORY:  The patient lives in [place] by herself.  She does not work.  She quit smoking about 50 years ago.  She denies alcohol or drugs and does not routinely exercise.  REVIEW OF SYSTEMS:  Positive  for weakness as outlined in the HPI.  She is a full code.  Otherwise all systems reviewed and negative.  PHYSICAL EXAMINATION:  VITAL SIGNS:  Temperature 98.0, heart rate 60, respirations 18, blood pressure 172/70, pulse ox 95% on room air. Weight 59.4 kg. GENERAL:  Pleasant white female in no acute distress, awake, alert, and oriented x3.  She has normal affect. HEENT:  Normal.  Nares grossly intact and nonfocal. SKIN:  Warm and dry without lesions or masses. NECK:  Supple with right-sided bruit versus radiated murmur.  No JVD. LUNGS:  Respirations are unlabored, clear to auscultation. CARDIAC:  Regular S1, S2 with 2/6 systolic ejection murmur at the retrosternal border. ABDOMEN:  Round, soft, nontender, nondistended.  Bowel sounds  present x4. EXTREMITIES:  Warm, dry, pink.  No clubbing, cyanosis, or edema. Dorsalis pedis, posterior tibial pulses 2+ and equal bilaterally.  Chest x-ray shows cardiomegaly without edema and bronchitic changes. Head CT showed cerebral atrophy without acute intracranial abnormality. There is remote left occipital infarct.  EKG shows AV paced rhythm at a rate of 62 beats per minute.  Hemoglobin 12.2, hematocrit 36.0, WBC 9.0, platelets 194,000.  Sodium 141, potassium 4.1, chloride 105, CO2 30, BUN 18, creatinine 0.83, glucose 102, total bilirubin 0.3, alkaline phosphatase 77, AST 19, ALT 11, total protein 6.2, albumin 3.3, hemoglobin A1c 6.3.  BNP 1743, cardiac markers negative x4.  INR 1.72. Serum iron 40, TIBC 408, B12 392, folate 13.4, ferritin 15, total cholesterol 140, triglycerides 94, HDL 51, LDL 70.  Urinalysis negative.  ASSESSMENT/PLAN: 1. Paroxysmal atrial fibrillation.  The patient presented with     weakness and was found by interrogation to have more frequent     episodes of paroxysmal atrial fibrillation.  Her rate on     presentation to the ED yesterday was 109, however, since admission     she is predominantly AV paced with some sinus rhythm and also brief     periods of tachycardia lasting just a few seconds appearing to be     paroxysmal atrial fibrillation.  She still feels slightly weak this     morning despite being predominately AV paced and this is not clear     that atrial fibrillation alone is the cause of her weakness.  We     would plan to continue her current regimen of diltiazem and     metoprolol along with Coumadin.  If she develops more frequent     paroxysms or persistent symptomatic atrial fibrillation, we will     need to consider antiarrhythmic therapy. 2. Weakness.  See #1.  The patient does have systolic murmur with last     echo being normal in 2008.  We will follow up 2-D echocardiogram     today.  She also has right-sided carotid bruit,  although this could     just represent a radiated murmur.  It should be noted that is     louder in her neck though at the base of her heart.  Followup     carotid ultrasound to rule out obstructive disease. 3. Hypertension.  Blood pressure was elevated this morning.  Follow up     after a.m. medications.     Nicolasa Ducking, ANP   ______________________________ Cassell Clement, M.D.    CB/MEDQ  D:  04/11/2011  T:  04/11/2011  Job:  [id number]  Electronically Signed by Nicolasa Ducking ANP on 04/16/2011 03:35:16 PM Electronically Signed by Cassell Clement M.D. on 04/19/2011 02:27:19 PM

## 2011-04-21 DIAGNOSIS — R079 Chest pain, unspecified: Secondary | ICD-10-CM

## 2011-05-05 NOTE — Discharge Summary (Signed)
NAME:  [name], [name] NO.:  [id number]  MEDICAL RECORD NO.:  [id number]  LOCATION:  3708                         FACILITY:  [facility]  PHYSICIAN:  Tilford Pillar, MD     DATE OF BIRTH:  06/12/1918  DATE OF ADMISSION:  04/16/2011 DATE OF DISCHARGE:  04/19/2011                              DISCHARGE SUMMARY   DISCHARGE DIAGNOSES: 1. Atrial fibrillation. 2. Atypical chest pain. 3. Anemia. 4. Hypothyroidism.  DISCHARGE MEDICATIONS: 1. Amiodarone 200 mg by mouth twice daily for 4 weeks, then 200 mg     daily. 2. Docusate sodium 100 mg by mouth twice daily as needed for     constipation. 3. Ferrous sulfate 325 mg by mouth twice daily. 4. Diltiazem CD/XT 360 mg by mouth daily. 5. Furosemide 20 mg by mouth daily as needed. 6. Isosorbide dinitrate 30 mg by mouth daily. 7. Levothyroxine 100 mcg Monday through Friday, 25 mcg Saturday and     Sunday by mouth. 8. Meclizine 12.5 mg by mouth daily as needed. 9. Metoprolol tartrate 50 mg by mouth twice daily. 10.Nitroglycerin sublingual 0.4 mg every 5 minutes as needed up to 3     doses. 11.Promethazine 25 mg by mouth every 6 hours as needed for nausea. 12.Tramadol 50 mg by mouth 4 times daily as needed for pain. 13.Tylenol 500 mg 1-2 tablets by mouth every 6 hours as needed. 14.Vitamin B12 over the counter 1 tablet by mouth daily. 15.Vitamin D OTC 1 tablet by mouth daily. 16.Warfarin 3 mg Tuesday, Wednesday, Friday, Saturday, and Sunday; 6     mg Monday and Thursday.  DISPOSITION AND FOLLOWUP:  The patient was discharged from [facility] on April 19, 2011, in stable and improved condition with resolution of chest pain and resolution of atrial fibrillation.  The patient will follow up with her PCP, Dr. [name], for management of her Coumadin dose as well as management of her chronic problems of anemia, hypothyroidism, and atrial fibrillation.  The patient will also follow up with her cardiologist, Dr.  Johney Frame, on May 19, 2011, for further management of atrial fibrillation.  PROCEDURES PERFORMED: 1. Chest x-ray, no acute cardiopulmonary disease. 2. EKG, atrial fibrillation, rate about 100, left bundle-branch block,     some beats paced, others not.  CONSULTANT:  Cardiology.  ADMITTING HISTORY AND PHYSICAL:  The patient is a 77 year old woman with a history of paroxysmal atrial fibrillation, left bundle-branch block, implanted dual-chamber pacemaker, prior CVA, and atypical chest pain, who presents after experiencing chest pain and shortness of breath this morning.  This patient previously presented to the hospital and was admitted to our service on April 10, 2011, with fatigue and found to be in atrial fibrillation.  During that hospitalization, her atrial fibrillation gradually resolved and she only experienced 2 episodes within the last 24 hours of hospitalization, lasting 1-3 seconds each.  An echo showed an ejection fraction of 55-60% and mild stenosis of the aortic valve.  Her symptoms had resolvedby discharge.  The morning of admission, she developed atypical chest  pain, located centrally in her chest without radiation, lasting for 2 hours, and presented to her PCP, who found her to be  in afib with RVR on EKG, and sent her to the ED.  She notes associated shortness of breath and nausea, and palpitations with the pain.  She reports a couple of loose stools last night, but has occasional loose stools at baseline.  No vomiting, fevers, chills, dysuria, hematuria, constipation, abdominal pain, numbness, tingling, speech changes, or facial droop.  PHYSICAL EXAMINATION:  VITAL SIGNS:  Temperature 97.5, blood pressure 138/73, pulse 76, respirations 20, oxygen saturation 97% on room air. GENERAL:  Alert and oriented, in no acute distress. HEENT:  Pupils equal, round, and reactive to light.  Extraocular movements intact.  Oropharynx is clear and nonerythematous. CARDIOVASCULAR:   Regular rate, regular rhythm (during exam, monitor shows all beats are paced beats), grade 2/6 ejection murmur over aortic area.  Pulses symmetric and intact bilaterally.  No pedal edema. PULMONARY:  Clear to auscultation bilaterally.  No wheezes, rales, or rhonchi. ABDOMEN:  Soft, nontender, nondistended.  Bowel sounds were normal.  No guarding present. MUSCULOSKELETAL:  No cyanosis, clubbing, or edema. NEUROLOGIC:  Alert and oriented x3.  Cranial nerves II-XII grossly intact.  Strength is 5/5 throughout.  Sensation intact.  ADMITTING LABS:  WBC 10.4, hemoglobin 11.4, hematocrit 35.4, platelets 190.  INR 2.57.  Sodium 137, potassium 4.6, chloride 103, bicarb 25, BUN 25, creatinine 0.94, glucose 115.  Cardiac enzymes negative.  HOSPITAL COURSE BY PROBLEM: 1. Atypical chest pain.  The patient's atypical chest pain was thought     to be likely due to atrial fibrillation.  The patient's atrial     fibrillation was controlled with her home medications of metoprolol     and diltiazem along with the addition of amiodarone.  The patient's     atrial fibrillation resolved within the first 24 hours of admission    and remained resolved until discharge.  Cardiology was consulted     and considered a TEE ablation, but decided against the procedure     given the patient's spontaneous resolution into normal sinus     rhythm.  The patient will plan to continue on amiodarone for the     foreseeable future.  Of note, the patient was found to be     therapeutic on Coumadin upon admission, however, missed one dose during     hospitalization and subsequently became subtherapeutic.  The     patient's INR on discharge had improved to 1.9. 2. Atrial fibrillation.  See note above for chest pain. 3. Hypothyroidism.  The patient has a history of hypothyroidism and     was continued on her home dose of Synthroid during hospitalization.     The patient had no symptoms of hypo or hyperthyroidism during      hospitalization.  The patient will follow up with her PCP for     further monitoring. 4. Anemia.  The patient has a history of anemia likely due to iron     deficiency discovered at her last hospitalization.  The patient was     continued on iron supplementation and was discharged with oral iron     supplementation as well as stool softeners as needed.  DISCHARGE VITALS:  Temperature 98.0, blood pressure 135/55, heart rate 60, respirations 16, oxygen saturation 95% on room air.  DISCHARGE LABS:  WBC 8.5, hemoglobin 12.2, hematocrit 37.1, platelets 224. INR 1.9, PT 22.1.  Sodium 138, potassium 3.5, chloride 100, bicarb 28, BUN 22, creatinine 0.97, glucose 88, magnesium 2.  Pro-BNP 1402.    ______________________________ Janalyn Harder, MD  ______________________________ Tilford Pillar, MD    RB/MEDQ  D:  04/20/2011  T:  04/21/2011  Job:  [id number]  cc:   Ernestina Penna, M.D. Hillis Range, MD  Electronically Signed by Janalyn Harder MD on 04/29/2011 06:11:35 PM Electronically Signed by Tilford Pillar  on 05/05/2011 03:25:41 PM

## 2011-05-13 ENCOUNTER — Other Ambulatory Visit: Payer: Self-pay | Admitting: Internal Medicine

## 2011-05-14 ENCOUNTER — Telehealth: Payer: Self-pay | Admitting: Internal Medicine

## 2011-05-14 NOTE — Telephone Encounter (Signed)
Per pharmacy has question regarding pacerone

## 2011-05-14 NOTE — Telephone Encounter (Signed)
Pharmacist calling to verify Pacerone dosage to be lowered to 200 mg once daily instead of twice.  New Rx sent still included the previous instructions.  Confirmed 200 mg once daily.  Judithe Modest, CMA

## 2011-05-17 ENCOUNTER — Encounter: Payer: Self-pay | Admitting: Internal Medicine

## 2011-05-19 ENCOUNTER — Encounter: Payer: Medicare Other | Admitting: Internal Medicine

## 2011-06-01 LAB — LIPID PANEL
Cholesterol: 159
HDL: 48
LDL Cholesterol: 96
Total CHOL/HDL Ratio: 3.3

## 2011-06-01 LAB — COMPREHENSIVE METABOLIC PANEL
AST: 24
Albumin: 3.3 — ABNORMAL LOW
BUN: 17
Calcium: 8.9
Chloride: 102
Creatinine, Ser: 0.81
GFR calc Af Amer: >60
Total Bilirubin: 0.6

## 2011-06-01 LAB — CBC
Hemoglobin: 12.4
MCV: 87.4
RBC: 4.23
WBC: 7.1

## 2011-06-01 LAB — CARDIAC PANEL(CRET KIN+CKTOT+MB+TROPI)
CK, MB: 2.3
Relative Index: INVALID
Relative Index: INVALID
Total CK: 59
Total CK: 62
Troponin I: 0.03

## 2011-06-01 LAB — POCT I-STAT, CHEM 8
BUN: 23
Calcium, Ion: 1.17
Creatinine, Ser: 1.1
Glucose, Bld: 87
Hemoglobin: 11.9 — ABNORMAL LOW
Sodium: 138
TCO2: 29

## 2011-06-01 LAB — TROPONIN I: Troponin I: 0.02

## 2011-06-01 LAB — POCT CARDIAC MARKERS
Operator id: 146091
Operator id: 146091
Troponin i, poc: <0.05

## 2011-06-01 LAB — HEPARIN LEVEL (UNFRACTIONATED): Heparin Unfractionated: 0.64

## 2011-06-01 LAB — PROTIME-INR
INR: 1
INR: 1.5
Prothrombin Time: 12.9

## 2011-06-01 LAB — CK TOTAL AND CKMB (NOT AT ARMC): Relative Index: INVALID

## 2011-06-03 ENCOUNTER — Encounter: Payer: Self-pay | Admitting: Internal Medicine

## 2011-06-03 ENCOUNTER — Ambulatory Visit (INDEPENDENT_AMBULATORY_CARE_PROVIDER_SITE_OTHER): Payer: Medicare Other | Admitting: Internal Medicine

## 2011-06-03 DIAGNOSIS — G9001 Carotid sinus syncope: Secondary | ICD-10-CM

## 2011-06-03 DIAGNOSIS — I10 Essential (primary) hypertension: Secondary | ICD-10-CM

## 2011-06-03 DIAGNOSIS — I4891 Unspecified atrial fibrillation: Secondary | ICD-10-CM

## 2011-06-03 DIAGNOSIS — Z950 Presence of cardiac pacemaker: Secondary | ICD-10-CM

## 2011-06-03 DIAGNOSIS — I495 Sick sinus syndrome: Secondary | ICD-10-CM

## 2011-06-03 LAB — PACEMAKER DEVICE OBSERVATION
AL THRESHOLD: 0.6 V
ATRIAL PACING PM: 64
BAMS-0002: 0 ms
BAMS-0003: 70 {beats}/min
RV LEAD THRESHOLD: 0.7 V

## 2011-06-03 NOTE — Progress Notes (Signed)
The patient presents today for electrophysiology followup after her hospitalization 8/12 for afib with RVR and atypical chest pain.  Since that time, the patient reports doing very well. She feels that her afib is well controlled with amiodarone.  Today, she denies symptoms of palpitations, chest pain, shortness of breath, orthopnea, PND, lower extremity edema, dizziness, presyncope, syncope, or neurologic sequela.  The patient feels that she is tolerating medications without difficulties and is otherwise without complaint today.   Past Medical History  Diagnosis Date  . Atrial fibrillation     paroxysmal  . OA (osteoarthritis)   . Gastritis   . B12 deficiency   . Anemia   . PUD (peptic ulcer disease)   . ASCVD (arteriosclerotic cardiovascular disease)   . Spinal stenosis of lumbosacral region   . Hypothyroid   . Tachycardia-bradycardia     s/p PPM (Guidant)  . CVA (cerebrovascular accident)   . Ovarian mass     benign    Past Surgical History  Procedure Date  . Lumbar disc surgery 1991  . Gastric ulcer surgery   . Total abdominal hysterectomy w/ bilateral salpingoophorectomy 10/03    ovarian cysts   . Pacemaker insertion     Guidant dual lead permanent pacemaker in 2007 secondary to carotid sinus hypersensitivity (pneumothorax after pacemaker). [employer]     Current Outpatient Prescriptions  Medication Sig Dispense Refill  . acetaminophen (TYLENOL) 500 MG tablet Take 500 mg by mouth every 6 (six) hours as needed.        Marland Kitchen amiodarone (PACERONE) 200 MG tablet Take 200 mg by mouth daily.       . Cholecalciferol (VITAMIN D) 2000 UNITS tablet Take 2,000 Units by mouth daily.        . Cyanocobalamin (VITAMIN B-12 IJ) Inject as directed every 30 (thirty) days.        Marland Kitchen diltiazem (TIAZAC) 360 MG 24 hr capsule Take 1 capsule by mouth Daily.      . furosemide (LASIX) 20 MG tablet Take 20 mg by mouth daily as needed.       . IRON PO Take 1 tablet by mouth daily.       .  isosorbide mononitrate (IMDUR) 30 MG 24 hr tablet Take 1 tablet by mouth Daily.      Marland Kitchen levothyroxine (SYNTHROID, LEVOTHROID) 100 MCG tablet Take 100 mcg by mouth daily.        . meclizine (ANTIVERT) 12.5 MG tablet Take 12.5 mg by mouth as needed.        . metoprolol (LOPRESSOR) 50 MG tablet Take 50 mg by mouth 2 (two) times daily.        . nitroGLYCERIN (NITROSTAT) 0.4 MG SL tablet Place 0.4 mg under the tongue every 5 (five) minutes as needed.        . promethazine (PHENERGAN) 25 MG tablet Take 25 mg by mouth every 6 (six) hours as needed. Take half tablet by mouth as needed      . traMADol (ULTRAM) 50 MG tablet Take 50 mg by mouth 4 (four) times daily as needed.        . warfarin (COUMADIN) 3 MG tablet Take 1 tablet by mouth as directed.        Allergies  Allergen Reactions  . Codeine Nausea Only    GI UPSET    History   Social History  . Marital Status: Widowed    Spouse Name: N/A    Number of Children: N/A  . Years of  Education: N/A   Occupational History  . Not on file.   Social History Main Topics  . Smoking status: Former Smoker -- 0.5 packs/day for 4 years    Types: Cigarettes    Quit date: 09/06/1956  . Smokeless tobacco: Never Used  . Alcohol Use: No  . Drug Use: No  . Sexually Active: Not on file   Other Topics Concern  . Not on file   Social History Narrative  . No narrative on file    ROS-  All systems are reviewed and are negative except as outlined in the HPI above   Physical Exam: Filed Vitals:   06/03/11 0958  BP: 145/84  Pulse: 60  Height: 5\' 7"  (1.702 m)  Weight: 137 lb (62.143 kg)    GEN- The patient is well appearing but elderly, alert and oriented x 3 today.   Head- normocephalic, atraumatic Eyes-  Sclera clear, conjunctiva pink Ears- hearing intact Oropharynx- clear Neck- supple, no JVP Lymph- no cervical lymphadenopathy Lungs- Clear to ausculation bilaterally, normal work of breathing Chest- pacemaker pocket is well healed Heart-  Regular rate and rhythm, 2/6 SEM LUSB (mid peaking) GI- soft, NT, ND, + BS Extremities- no clubbing, cyanosis, or edema MS- age appropriate atrophy Skin- no rash or lesion Psych- euthymic mood, full affect Neuro- strength and sensation are intact  Pacemaker interrogation- reviewed in detail today,  See PACEART report  Assessment and Plan:

## 2011-06-03 NOTE — Assessment & Plan Note (Signed)
Doing well s/p PPM without further syncope.  Normal pacemaker function See Arita Miss Art report No changes today

## 2011-06-03 NOTE — Assessment & Plan Note (Signed)
Improved with amiodarone By pacemaker, no afib since 8/12  Continue amiodarone 200mg  daily.  She wishes to have TFTs and LFTs followed by Dr Kathi Der office. She should have TFTs/LFTs twice per year. Continue coumadin (goal INR 2-3)

## 2011-06-03 NOTE — Assessment & Plan Note (Signed)
Stable No change required today  

## 2011-06-03 NOTE — Assessment & Plan Note (Signed)
Normal pacemaker function See Arita Miss Art report No changes today  Heart rates are controlled

## 2011-06-15 ENCOUNTER — Encounter: Payer: Self-pay | Admitting: Internal Medicine

## 2011-06-21 LAB — PROTIME-INR
INR: 1.7 — ABNORMAL HIGH
INR: 1.7 — ABNORMAL HIGH
INR: 2 — ABNORMAL HIGH
Prothrombin Time: 20.4 — ABNORMAL HIGH
Prothrombin Time: 23.3 — ABNORMAL HIGH

## 2011-06-21 LAB — URINALYSIS, ROUTINE W REFLEX MICROSCOPIC
Hgb urine dipstick: NEGATIVE
Nitrite: NEGATIVE
Specific Gravity, Urine: 1.012
Urobilinogen, UA: 1

## 2011-06-21 LAB — CBC
HCT: 33.7 — ABNORMAL LOW
Hemoglobin: 11.2 — ABNORMAL LOW
MCHC: 33.8
Platelets: 250
Platelets: 266
RBC: 4.51
RDW: 15.1 — ABNORMAL HIGH
RDW: 15.6 — ABNORMAL HIGH
WBC: 6.7
WBC: 7.2

## 2011-06-21 LAB — DIFFERENTIAL
Basophils Relative: 0
Eosinophils Absolute: 0.1
Eosinophils Relative: 1
Monocytes Absolute: 0.5
Monocytes Relative: 6
Neutro Abs: 5.3

## 2011-06-21 LAB — COMPREHENSIVE METABOLIC PANEL
ALT: 15
AST: 25
Albumin: 3.4 — ABNORMAL LOW
Albumin: 3.9
Alkaline Phosphatase: 58
Alkaline Phosphatase: 63
BUN: 13
Chloride: 102
GFR calc Af Amer: >60
Glucose, Bld: 117 — ABNORMAL HIGH
Potassium: 4.1
Potassium: 4.5
Sodium: 137
Total Bilirubin: 1
Total Protein: 6.9

## 2011-06-21 LAB — CARDIAC PANEL(CRET KIN+CKTOT+MB+TROPI)
CK, MB: 1.9
CK, MB: 2.1

## 2011-06-21 LAB — CK TOTAL AND CKMB (NOT AT ARMC): Relative Index: INVALID

## 2011-06-21 LAB — IRON AND TIBC
Iron: 24 — ABNORMAL LOW
TIBC: 430

## 2011-06-21 LAB — BASIC METABOLIC PANEL
BUN: 13
CO2: 25
Calcium: 8.4
Creatinine, Ser: 0.77
GFR calc Af Amer: >60
Glucose, Bld: 102 — ABNORMAL HIGH

## 2011-06-21 LAB — POCT CARDIAC MARKERS
Myoglobin, poc: 75.7
Operator id: 1211

## 2011-06-23 ENCOUNTER — Encounter: Payer: Self-pay | Admitting: Internal Medicine

## 2011-10-19 ENCOUNTER — Encounter: Payer: Self-pay | Admitting: Internal Medicine

## 2011-10-19 DIAGNOSIS — I495 Sick sinus syndrome: Secondary | ICD-10-CM

## 2011-11-08 ENCOUNTER — Encounter: Payer: Self-pay | Admitting: Internal Medicine

## 2011-11-09 ENCOUNTER — Encounter: Payer: Self-pay | Admitting: Internal Medicine

## 2011-12-13 ENCOUNTER — Other Ambulatory Visit: Payer: Self-pay | Admitting: Internal Medicine

## 2012-01-18 ENCOUNTER — Encounter: Payer: Self-pay | Admitting: Internal Medicine

## 2012-01-18 DIAGNOSIS — I495 Sick sinus syndrome: Secondary | ICD-10-CM

## 2012-04-10 ENCOUNTER — Encounter: Payer: Medicare Other | Admitting: Internal Medicine

## 2012-04-18 ENCOUNTER — Encounter: Payer: Self-pay | Admitting: Internal Medicine

## 2012-05-12 ENCOUNTER — Encounter: Payer: Self-pay | Admitting: Internal Medicine

## 2012-05-12 ENCOUNTER — Ambulatory Visit (INDEPENDENT_AMBULATORY_CARE_PROVIDER_SITE_OTHER): Payer: Medicare Other | Admitting: Internal Medicine

## 2012-05-12 VITALS — BP 120/60 | HR 64 | Ht 67.0 in | Wt 131.0 lb

## 2012-05-12 DIAGNOSIS — I495 Sick sinus syndrome: Secondary | ICD-10-CM

## 2012-05-12 DIAGNOSIS — R079 Chest pain, unspecified: Secondary | ICD-10-CM

## 2012-05-12 DIAGNOSIS — I4891 Unspecified atrial fibrillation: Secondary | ICD-10-CM

## 2012-05-12 DIAGNOSIS — R0602 Shortness of breath: Secondary | ICD-10-CM

## 2012-05-12 LAB — PACEMAKER DEVICE OBSERVATION
AL AMPLITUDE: 2.1 mv
AL IMPEDENCE PM: 460 Ohm
ATRIAL PACING PM: 100
RV LEAD IMPEDENCE PM: 560 Ohm
VENTRICULAR PACING PM: 100

## 2012-05-12 IMAGING — CR DG CHEST 1V PORT
1 series · 1 of 1 positions shown · non-contrast
Comparison: 04/10/2011

CLINICAL DATA: Chest pain.

PORTABLE CHEST - 1 VIEW

[AP]
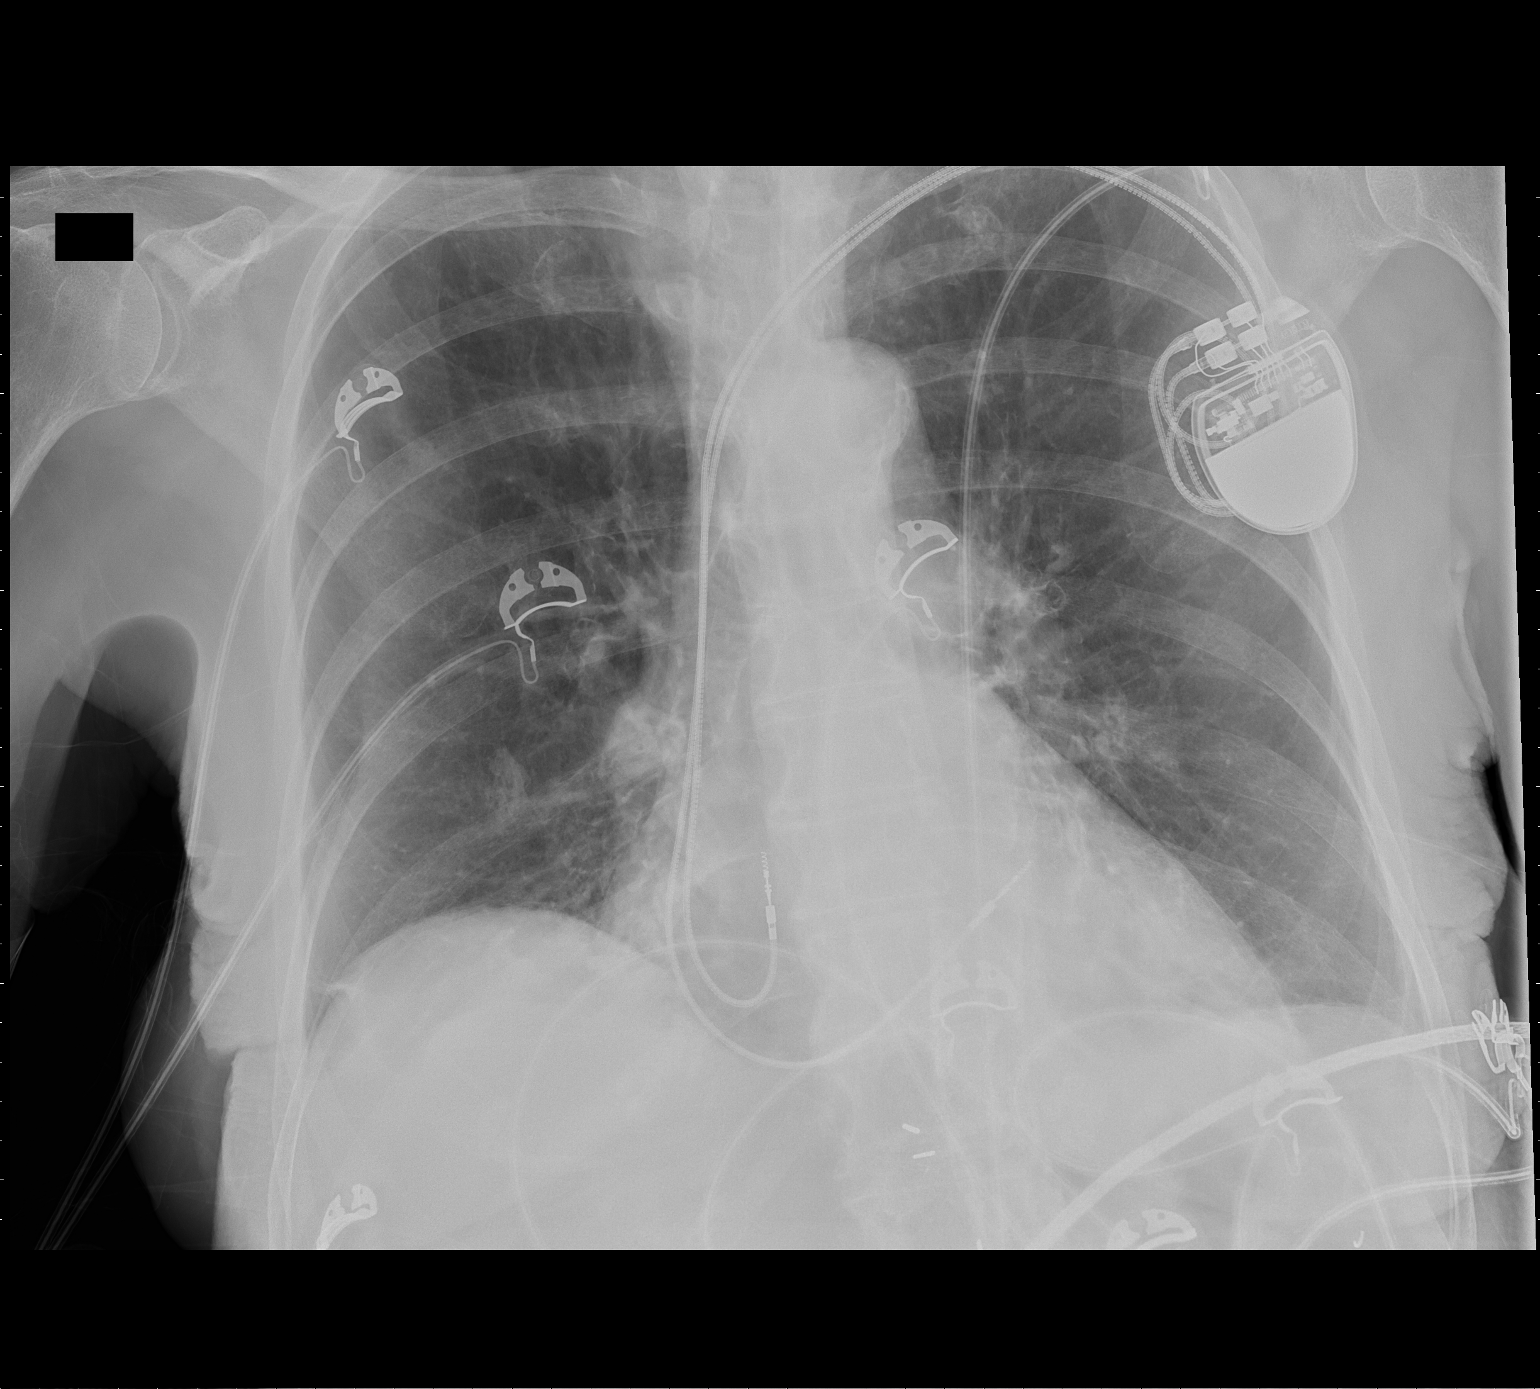

[1 of 1 positions shown; findings below may reference images not displayed]

FINDINGS: Dual lead pacer with leads right atrium, right ventricle.
Midline trachea.  Normal heart size and mediastinal contours for
age.  Minimal right hemidiaphragm elevation. No pleural effusion or
pneumothorax.  Biapical pleural thickening.

A nodular density projects over the right lower lobe and measures
1.7 cm.  Present back 11/01/2005 (consistent with a benign
process).  Interstitial thickening is lower lobe predominant and
nonspecific in this age group.
Surgical clips in the left upper quadrant
IMPRESSION: No acute cardiopulmonary disease.

## 2012-05-12 NOTE — Patient Instructions (Addendum)
Your physician wants you to follow-up in: 6 months with [facility] device clinic and 12 months with Dr Johney Frame.  You will receive a reminder letter in the mail two months in advance. If you don't receive a letter, please call our office to schedule the follow-up appointment.  Your physician recommends that you schedule a follow-up appointment in 6-8 weeks with Dr. Antoine Poche at the [place] office.  Your physician recommends that you continue on your current medications as directed. Please refer to the Current Medication list given to you today.   Your physician has requested that you have an echocardiogram. Echocardiography is a painless test that uses sound waves to create images of your heart. It provides your doctor with information about the size and shape of your heart and how well your heart's chambers and valves are working. This procedure takes approximately one hour. There are no restrictions for this procedure.

## 2012-05-12 NOTE — Assessment & Plan Note (Signed)
Normal pacemaker function See Pace Art report No changes today  

## 2012-05-12 NOTE — Assessment & Plan Note (Signed)
Well controlled with amiodarone Appropriately anticoagulated

## 2012-05-12 NOTE — Assessment & Plan Note (Signed)
Given chest pain and SOB in the setting of an aortic murmur suggestive of significant AS, I will order an echo. Given her advanced age, she would be a poor candidate for procedures but I think that the information that an echo would provide would be very beneficial in her management.  She has seen Dr Antoine Poche previously and is interested in seeing him again in [place]. I will make referal for her to see him for medical management of her chest pain and SOB going forward.  I will see her in a year for pacemaker follow-up.

## 2012-05-12 NOTE — Progress Notes (Signed)
PCP: [name], MD Primary Cardiologist:  She has seen Dr Antoine Poche several years ago.  The patient presents today for electrophysiology followup.  She appears to be doing reasonable well.  She is unaware of symptoms of afib.  She reports occasional chest pain for which she finds relief with slNTG.  She also has SOB with moderate exertion, but appears to be mostly limited by her fradgility and arthritis.  She has stable edema.   Today, she denies symptoms of palpitations, orthopnea, PND, dizziness, presyncope, syncope, or neurologic sequela.  The patient feels that she is tolerating medications without difficulties and is otherwise without complaint today.   Past Medical History  Diagnosis Date  . Atrial fibrillation     paroxysmal  . OA (osteoarthritis)   . Gastritis   . B12 deficiency   . Anemia   . PUD (peptic ulcer disease)   . ASCVD (arteriosclerotic cardiovascular disease)   . Spinal stenosis of lumbosacral region   . Hypothyroid   . Tachycardia-bradycardia     s/p PPM ([employer]ant)  . CVA (cerebrovascular accident)   . Ovarian mass     benign    Past Surgical History  Procedure Date  . Lumbar disc surgery 1991  . Gastric ulcer surgery   . Total abdominal hysterectomy w/ bilateral salpingoophorectomy 10/03    ovarian cysts   . Pacemaker insertion     Guidant dual lead permanent pacemaker in 2007 secondary to carotid sinus hypersensitivity (pneumothorax after pacemaker). [employer]     Current Outpatient Prescriptions  Medication Sig Dispense Refill  . acetaminophen (TYLENOL) 500 MG tablet Take 500 mg by mouth every 6 (six) hours as needed.        Marland Kitchen amiodarone (PACERONE) 200 MG tablet Take 200 mg by mouth daily.       . Cholecalciferol (VITAMIN D) 2000 UNITS tablet Take 2,000 Units by mouth daily.        . Cyanocobalamin (VITAMIN B-12 IJ) Inject as directed every 30 (thirty) days.        Marland Kitchen diltiazem (TIAZAC) 360 MG 24 hr capsule Take 1 capsule by mouth Daily.        . furosemide (LASIX) 20 MG tablet Take 20 mg by mouth daily as needed.       Marland Kitchen HYDROcodone-acetaminophen (VICODIN) 5-500 MG per tablet Take 1 tablet by mouth every 8 (eight) hours as needed.       . IRON PO Take 1 tablet by mouth daily.       . isosorbide mononitrate (IMDUR) 30 MG 24 hr tablet Take 1 tablet by mouth Daily.      Marland Kitchen levothyroxine (SYNTHROID, LEVOTHROID) 137 MCG tablet Take 137 mcg by mouth daily.      . meclizine (ANTIVERT) 12.5 MG tablet Take 12.5 mg by mouth as needed.        . metoprolol (LOPRESSOR) 50 MG tablet Take 50 mg by mouth 2 (two) times daily.        . nitroGLYCERIN (NITROSTAT) 0.4 MG SL tablet Place 0.4 mg under the tongue every 5 (five) minutes as needed.        . promethazine (PHENERGAN) 25 MG tablet Take 25 mg by mouth every 6 (six) hours as needed. Take half tablet by mouth as needed      . traMADol (ULTRAM) 50 MG tablet Take 50 mg by mouth 4 (four) times daily as needed.        . warfarin (COUMADIN) 3 MG tablet Take 1 tablet by mouth  as directed.      Marland Kitchen DISCONTD: PACERONE 200 MG tablet TAKE 1 TABLET DAILY  90 each  3    Allergies  Allergen Reactions  . Codeine Nausea Only    GI UPSET    History   Social History  . Marital Status: Widowed    Spouse Name: N/A    Number of Children: N/A  . Years of Education: N/A   Occupational History  . Not on file.   Social History Main Topics  . Smoking status: Former Smoker -- 0.5 packs/day for 4 years    Types: Cigarettes    Quit date: 09/06/1956  . Smokeless tobacco: Never Used  . Alcohol Use: No  . Drug Use: No  . Sexually Active: Not on file   Other Topics Concern  . Not on file   Social History Narrative  . No narrative on file    Physical Exam: Filed Vitals:   05/12/12 1111  BP: 120/60  Pulse: 64  Height: 5\' 7"  (1.702 m)  Weight: 131 lb (59.421 kg)    GEN- The patient is well appearing but elderly, alert and oriented x 3 today.   Head- normocephalic, atraumatic Eyes-  Sclera clear,  conjunctiva pink Ears- hearing intact Oropharynx- clear Neck- supple, no JVP Lymph- no cervical lymphadenopathy Lungs- Clear to ausculation bilaterally, normal work of breathing Chest- pacemaker pocket is well healed Heart- Regular rate and rhythm, 2/6 SEM LUSB (mid to late peaking) GI- soft, NT, ND, + BS Extremities- no clubbing, cyanosis, +1 edema MS- age appropriate atrophy Skin- no rash or lesion Psych- euthymic mood, full affect Neuro- strength and sensation are intact  Pacemaker interrogation- reviewed in detail today,  See PACEART report  Assessment and Plan:

## 2012-05-24 ENCOUNTER — Encounter: Payer: Self-pay | Admitting: Cardiology

## 2012-06-14 ENCOUNTER — Encounter: Payer: Self-pay | Admitting: Cardiology

## 2012-06-14 ENCOUNTER — Ambulatory Visit (INDEPENDENT_AMBULATORY_CARE_PROVIDER_SITE_OTHER): Payer: Medicare Other | Admitting: Cardiology

## 2012-06-14 VITALS — BP 120/65 | HR 66 | Ht 65.0 in | Wt 131.0 lb

## 2012-06-14 DIAGNOSIS — Z8669 Personal history of other diseases of the nervous system and sense organs: Secondary | ICD-10-CM

## 2012-06-14 DIAGNOSIS — I4891 Unspecified atrial fibrillation: Secondary | ICD-10-CM

## 2012-06-14 DIAGNOSIS — R079 Chest pain, unspecified: Secondary | ICD-10-CM

## 2012-06-14 DIAGNOSIS — R0989 Other specified symptoms and signs involving the circulatory and respiratory systems: Secondary | ICD-10-CM

## 2012-06-14 DIAGNOSIS — I495 Sick sinus syndrome: Secondary | ICD-10-CM

## 2012-06-14 DIAGNOSIS — R0600 Dyspnea, unspecified: Secondary | ICD-10-CM

## 2012-06-14 DIAGNOSIS — I10 Essential (primary) hypertension: Secondary | ICD-10-CM

## 2012-06-14 NOTE — Patient Instructions (Addendum)
The current medical regimen is effective;  continue present plan and medications.  Follow up with Dr Antoine Poche in November 2014

## 2012-06-14 NOTE — Progress Notes (Signed)
HPI The patient presents for evaluation chest pain and shortness of breath. I saw her years ago. Most recently she was followed by Dr. Johney Frame.  She has a pacemaker and has had atrial fibrillation. She was describing some shortness of breath and chest discomfort. She has an obvious aortic stenosis murmur and he referred her for an echocardiogram and followup here. However, she canceled the echocardiogram. Today she says " what would you do with that information."   She presents today to further discuss this.  She lives alone. She doesn't like chores and walks to the grocery store. She does occasionally get chest discomfort and thinks that she has to take nitroglycerin a couple of times per month.  The chest discomfort that she gets goes away typically with one nitroglycerin. She thinks it has been a stable pattern. It happens sporadically and she can't bring it on necessarily with activity. She will get dyspneic with moderate activity but not with her usual chores. She has some episodes of lightheadedness standing. She will rest her head on the counter and symptoms resolved quickly. She has never had presyncope or syncope. Of note she says she used to have symptoms including increasing chest discomfort prior to her pacemaker but this is much improved. She denies any PND or orthopnea. She has had no weight gain or edema.   Allergies  Allergen Reactions  . Codeine Nausea Only    GI UPSET    Current Outpatient Prescriptions  Medication Sig Dispense Refill  . acetaminophen (TYLENOL) 500 MG tablet Take 500 mg by mouth every 6 (six) hours as needed.        Marland Kitchen amiodarone (PACERONE) 200 MG tablet Take 200 mg by mouth daily.       . Cholecalciferol (VITAMIN D) 2000 UNITS tablet Take 2,000 Units by mouth daily.        . Cyanocobalamin (VITAMIN B-12 IJ) Inject as directed every 30 (thirty) days.        Marland Kitchen diltiazem (TIAZAC) 360 MG 24 hr capsule Take 1 capsule by mouth Daily.      . furosemide (LASIX) 20 MG  tablet Take 20 mg by mouth daily as needed.       Marland Kitchen HYDROcodone-acetaminophen (VICODIN) 5-500 MG per tablet Take 1 tablet by mouth every 8 (eight) hours as needed.       . IRON PO Take 1 tablet by mouth daily.       . isosorbide mononitrate (IMDUR) 30 MG 24 hr tablet Take 1 tablet by mouth Daily.      Marland Kitchen levothyroxine (SYNTHROID, LEVOTHROID) 137 MCG tablet Take 137 mcg by mouth daily.      . meclizine (ANTIVERT) 12.5 MG tablet Take 12.5 mg by mouth as needed.        . metoprolol (LOPRESSOR) 50 MG tablet Take 50 mg by mouth 2 (two) times daily.        . nitroGLYCERIN (NITROSTAT) 0.4 MG SL tablet Place 0.4 mg under the tongue every 5 (five) minutes as needed.        . promethazine (PHENERGAN) 25 MG tablet Take 25 mg by mouth every 6 (six) hours as needed. Take half tablet by mouth as needed      . traMADol (ULTRAM) 50 MG tablet Take 50 mg by mouth 4 (four) times daily as needed.        . warfarin (COUMADIN) 3 MG tablet Take 1 tablet by mouth as directed.        Past Medical History  Diagnosis Date  . Atrial fibrillation     paroxysmal  . OA (osteoarthritis)   . Gastritis   . B12 deficiency   . Anemia   . PUD (peptic ulcer disease)   . ASCVD (arteriosclerotic cardiovascular disease)   . Spinal stenosis of lumbosacral region   . Hypothyroid   . Tachycardia-bradycardia     s/p PPM ([employer])  . CVA (cerebrovascular accident)   . Ovarian mass     benign     Past Surgical History  Procedure Date  . Lumbar disc surgery 1991  . Gastric ulcer surgery   . Total abdominal hysterectomy w/ bilateral salpingoophorectomy 10/03    ovarian cysts   . Pacemaker insertion     Guidant dual lead permanent pacemaker in 2007 secondary to carotid sinus hypersensitivity (pneumothorax after pacemaker). [employer]     ROS:  As stated in the HPI and negative for all other systems.  PHYSICAL EXAM BP 120/65  Pulse 66  Ht 5\' 5"  (1.651 m)  Wt 59.421 kg (131 lb)  BMI 21.80 kg/m2 GENERAL:  Frail  appearing, no distress HEENT:  Pupils equal round and reactive, fundi not visualized, oral mucosa unremarkable NECK:  No jugular venous distention, waveform within normal limits, carotid upstroke brisk and symmetric, no bruits, no thyromegaly LYMPHATICS:  No cervical, inguinal adenopathy LUNGS:  Clear to auscultation bilaterally BACK:  Lordotic CHEST:  Unremarkable HEART:  PMI not displaced or sustained,S1 and S2 within normal limits, no S3, no S4, no clicks, no rubs, 2/6 mid peaking apical systolic murmur radiating out the aortic outflow tract, no diastolic murmurs ABD:  Flat, positive bowel sounds normal in frequency in pitch, no bruits, no rebound, no guarding, no midline pulsatile mass, no hepatomegaly, no splenomegaly EXT:  2 plus pulses upper, absent dorsalis pedis and posterior tibialis bilateral, moderate bilateral edema, no cyanosis no clubbing SKIN:  No rashes no nodules NEURO:  Cranial nerves II through XII grossly intact, motor grossly intact throughout PSYCH:  Cognitively intact, oriented to person place and time   EKG:  Atrial ventricular  pacing, rate 66, first degree AV block.  06/14/2012   ASSESSMENT AND PLAN   Tachycardia-bradycardia -  She is up to date with pacemaker follow up.  No change in therapy is indicated.   A-fib -  She is having no symptomatic paroxysms of this and per Dr. Johney Frame she will continue on amiodarone  Chest pain -  This has been a stable pattern. She prefers conservative therapy. She will let me know if this increases in the future at which point I would likely increase her Imdur.  Aortic stenosis - The patient clearly has some degree of aortic stenosis but she is not in distress and so extraordinary therapies such as valvuloplasty or TAVR WOULD not be indicated. Therefore, I will not order an echo. However, she will let me know in the future whether she has increasing dyspnea.

## 2012-08-09 DIAGNOSIS — I495 Sick sinus syndrome: Secondary | ICD-10-CM

## 2012-10-11 ENCOUNTER — Encounter: Payer: Self-pay | Admitting: Cardiology

## 2012-11-08 DIAGNOSIS — I495 Sick sinus syndrome: Secondary | ICD-10-CM

## 2012-12-01 ENCOUNTER — Other Ambulatory Visit: Payer: Self-pay | Admitting: Cardiology

## 2012-12-01 MED ORDER — AMIODARONE HCL 200 MG PO TABS: 200.0000 mg | ORAL_TABLET | Freq: Every day | ORAL | Status: DC

## 2012-12-13 ENCOUNTER — Telehealth: Payer: Self-pay | Admitting: Pharmacist

## 2012-12-13 NOTE — Telephone Encounter (Signed)
[name] returned tammy's call. I advised she would call her back

## 2012-12-13 NOTE — Telephone Encounter (Signed)
Pt's daughter in law mary called to confirm appt for 12/18/12 - was wondering if protime could wait until appt with dr. Christell Constant on 01/10/13. I explained that  I felt that was too long to wait for another protime as it would be greater than a 1 month since previous protime.  Corrie Dandy said they would be here Monday 12/18/12 as scheduled.

## 2012-12-18 ENCOUNTER — Ambulatory Visit (INDEPENDENT_AMBULATORY_CARE_PROVIDER_SITE_OTHER): Payer: Medicare Other | Admitting: Pharmacist

## 2012-12-18 DIAGNOSIS — D649 Anemia, unspecified: Secondary | ICD-10-CM

## 2012-12-18 DIAGNOSIS — I4891 Unspecified atrial fibrillation: Secondary | ICD-10-CM

## 2012-12-18 DIAGNOSIS — R791 Abnormal coagulation profile: Secondary | ICD-10-CM

## 2012-12-18 LAB — POCT HEMOGLOBIN: Hemoglobin: 10.8 g/dL — AB (ref 12.2–16.2)

## 2012-12-18 LAB — POCT INR: INR: 5

## 2012-12-18 MED ORDER — RANITIDINE HCL 75 MG PO TABS: 75.0000 mg | ORAL_TABLET | Freq: Two times a day (BID) | ORAL | Status: DC

## 2012-12-18 MED ORDER — CYANOCOBALAMIN 1000 MCG/ML IJ SOLN
1000.0000 ug | INTRAMUSCULAR | Status: DC
  Administered 2012-12-18 – 2013-03-29 (×4): 1000 ug via INTRAMUSCULAR

## 2012-12-18 NOTE — Progress Notes (Signed)
Hgb was 10.8 today which is decreased slightly from last Hgb of 11.4 in Feb 2014. FOBT was normal in Feb.  2014  Discussed with Dr. Christell Constant - pt's PCP. He suggested recheck Hgb at next protime appt and that pt consider zantac 75mg  1poqd

## 2012-12-20 ENCOUNTER — Other Ambulatory Visit: Payer: Self-pay | Admitting: *Deleted

## 2012-12-20 MED ORDER — HYDROCODONE-ACETAMINOPHEN 5-325 MG PO TABS: ORAL_TABLET | ORAL | Status: DC

## 2012-12-20 NOTE — Telephone Encounter (Signed)
LAST REFILL 11/13/12. LAST OV 3/14 WITH TAMMY

## 2013-01-01 ENCOUNTER — Ambulatory Visit (INDEPENDENT_AMBULATORY_CARE_PROVIDER_SITE_OTHER): Payer: Medicare Other | Admitting: Pharmacist

## 2013-01-01 DIAGNOSIS — I4891 Unspecified atrial fibrillation: Secondary | ICD-10-CM

## 2013-01-01 DIAGNOSIS — D649 Anemia, unspecified: Secondary | ICD-10-CM

## 2013-01-01 LAB — POCT INR: INR: 2.2

## 2013-01-01 NOTE — Progress Notes (Signed)
S/O:  Patient c/o of edma. Per her daughter in law her legs were swollen and weeping all day yesterday.   2+LLE bilaterally today. No weeping today.  Patient has rx for furosemide 20mg  BID but she admits to not always taking twice a day.  Hasn't taken any furosemide today.   INR was 2.2 today  HBG was 9.8% today - a decrease from 10.8% 2 weeks ago FOBT was negative (10/2012)  A/P: 1.  Edema - take 20mg  furosemide when gets home and then 40mg  in the am for next 3 days. If swelling does not improve or if weeping returns pt is instructed to call office.  Daily weights 2.  Anemia - discussed with Dr. Christell Constant.  He recommended repeat FOBT and recheck HGB next week 3.  Therapeutic Anticogulation - INR goal is 2.0-3.0.   Continue warfarin at current dose (see below).   Recheck PT/INR in 2 weeks   Anticoagulation Dose Instructions as of 01/01/2013     [name] Tue Wed Thu Fri Sat   New Dose 3 mg 3 mg 3 mg 3 mg 3 mg 1.5 mg 3 mg    Description       Continue 3mg  daily except 1/2 tablet on Fridays.

## 2013-01-01 NOTE — Patient Instructions (Addendum)
Weigh daily:  If weigh increases by more than 2lbs. In 24 hours or 5 lbs in 1 week then call office.  Take furosemide 20mg  when returns home.  Then take 40mg  for next 3 days.  Call if swelling does not improve or if legs start weeping again.   Anticoagulation Dose Instructions as of 01/01/2013     [name] Tue Wed Thu Fri Sat   New Dose 3 mg 3 mg 3 mg 3 mg 3 mg 1.5 mg 3 mg    Description       Continue 3mg  daily except 1/2 tablet on Fridays.     INR was 2.2 today

## 2013-01-03 ENCOUNTER — Other Ambulatory Visit: Payer: Self-pay | Admitting: Family Medicine

## 2013-01-04 ENCOUNTER — Telehealth: Payer: Self-pay | Admitting: Pharmacist

## 2013-01-04 NOTE — Telephone Encounter (Signed)
Patient's daughter in law called and discussed results of Hgb

## 2013-01-10 ENCOUNTER — Encounter: Payer: Self-pay | Admitting: Family Medicine

## 2013-01-10 ENCOUNTER — Ambulatory Visit (INDEPENDENT_AMBULATORY_CARE_PROVIDER_SITE_OTHER): Payer: Medicare Other | Admitting: Family Medicine

## 2013-01-10 VITALS — BP 111/58 | HR 60 | Temp 97.3°F | Ht 62.25 in | Wt 127.6 lb

## 2013-01-10 DIAGNOSIS — R0602 Shortness of breath: Secondary | ICD-10-CM

## 2013-01-10 DIAGNOSIS — R531 Weakness: Secondary | ICD-10-CM

## 2013-01-10 DIAGNOSIS — I7090 Unspecified atherosclerosis: Secondary | ICD-10-CM

## 2013-01-10 DIAGNOSIS — I2510 Atherosclerotic heart disease of native coronary artery without angina pectoris: Secondary | ICD-10-CM

## 2013-01-10 DIAGNOSIS — D518 Other vitamin B12 deficiency anemias: Secondary | ICD-10-CM

## 2013-01-10 DIAGNOSIS — D649 Anemia, unspecified: Secondary | ICD-10-CM

## 2013-01-10 DIAGNOSIS — R5383 Other fatigue: Secondary | ICD-10-CM

## 2013-01-10 DIAGNOSIS — R569 Unspecified convulsions: Secondary | ICD-10-CM

## 2013-01-10 LAB — POCT HEMOGLOBIN: Hemoglobin: 11 g/dL — AB (ref 12.2–16.2)

## 2013-01-10 LAB — THYROID PANEL WITH TSH: T4, Total: 12.6 ug/dL — ABNORMAL HIGH (ref 5.0–12.5)

## 2013-01-10 NOTE — Progress Notes (Signed)
Subjective:    Patient ID: [name], female    DOB: 1917/10/29, 79 y.o.   MRN: [id number]  HPI  This patient presents for recheck of multiple medical problems. Her daughter-in-law accompanies the patient today.  Patient Active Problem List   Diagnosis Date Noted  . Dyspnea 06/14/2012  . A-fib 08/19/2009  . Chest pain 08/19/2009  . HYPOTHYROIDISM 02/08/2009  . ANEMIA, B12 DEFICIENCY 02/08/2009  . CAROTID SINUS SYNDROME 02/08/2009  . HYPERTENSION, UNSPECIFIED 02/08/2009  . LEFT BUNDLE BRANCH BLOCK 02/08/2009  . PAROXYSMAL ATRIAL FIBRILLATION 02/08/2009  . CVA 02/08/2009  . OSTEOARTHRITIS 02/08/2009  . SYNCOPE 02/08/2009  . CATARACT, HX OF 02/08/2009  . GASTRITIS, HX OF 02/08/2009  . MIGRAINES, HX OF 02/08/2009  . Tachycardia-bradycardia 02/08/2009    In addition, see review of systems.  She is having spells at home of weakness and shortness of breath and these occur sometimes with walking and sometimes while just sitting around.  The sitter who stays with her has never noticed that she seems weaker on one side than the other and has never noticed any obvious confusion.  The patient is aware when these spells are going on.  She will stop and lay down and the symptoms resolve.    The allergies, current medications, past medical history, surgical history, family and social history are reviewed.  Immunizations reviewed.  Health maintenance reviewed.  The following items are outstanding: None    .   Review of Systems  Constitutional: Positive for fatigue.  HENT: Positive for postnasal drip (slightly thick).   Eyes: Negative.   Respiratory: Positive for shortness of breath.   Cardiovascular: Positive for chest pain (occasional) and leg swelling.  Gastrointestinal: Positive for constipation (slight).  Genitourinary: Negative.   Musculoskeletal: Positive for back pain (mostly LBP).  Skin: Negative.   Neurological: Positive for dizziness, tremors, weakness and headaches.   Psychiatric/Behavioral: Positive for sleep disturbance (occasional due to pain).       Objective:   Physical Exam BP 111/58  Pulse 60  Temp(Src) 97.3 F (36.3 C) (Oral)  Ht 5' 2.25" (1.581 m)  Wt 127 lb 9.6 oz (57.879 kg)  BMI 23.16 kg/m2  The patient appeared well nourished and normally developed, alert and oriented to time and place. Speech, behavior and judgement appear normal. Vital signs as documented.  Head exam is unremarkable. No scleral icterus or pallor noted. There is some wax bilaterally Neck is without jugular venous distension, thyromegally, or carotid bruits. Carotid upstrokes are brisk bilaterally. No cervical adenopathy. Lungs are clear anteriorly and posteriorly to auscultation. Normal respiratory effort. Cardiac exam reveals regular rate and rhythm 60 per minute. First and second heart sounds normal.  No murmurs, rubs or gallops.  Abdominal exam reveals normal bowl sounds, no masses, increased gas, no organomegaly and no aortic enlargement. No inguinal adenopathy. Extremities are 3 +edematous. There is no sign of any bleeding from her feet. Skin without  jaundice. There is some pallor bilaterally. Warm and dry, without rash.           Assessment & Plan:  1. Anemia - POCT hemoglobin  2. ASCVD (arteriosclerotic cardiovascular disease) - BASIC METABOLIC PANEL WITH GFR  3. Weakness - POCT hemoglobin - Brain natriuretic peptide - Thyroid Panel With TSH  4. Shortness of breath - Brain natriuretic peptide  5. Spells These are probably multifactorial. They are probably secondary to her heart rhythm, her kidney disease, her medication, and her spinal stenosis. Also affecting this could be her inability  to be active.  6. ANEMIA, B12 DEFICIENCY   Patient Instructions  Please notify your family members if you notice any more bleeding or have any severe weak spells or shortness of breath spells Continue to use walker and be careful not to fall Elevate  legs as much as possible during the day to reduce the amount of swelling Try to drink water and fluids regularly Continue medications as currently doing Let Dr. Johney Frame be aware of these spells that you're having at home. If Dr. already thinks is appropriate we could do a [employer] monitor.

## 2013-01-10 NOTE — Patient Instructions (Addendum)
Please notify your family members if you notice any more bleeding or have any severe weak spells or shortness of breath spells Continue to use walker and be careful not to fall Elevate legs as much as possible during the day to reduce the amount of swelling Try to drink water and fluids regularly Continue medications as currently doing Let Dr. Johney Frame be aware of these spells that you're having at home. If Dr. already thinks is appropriate we could do a [employer] monitor.

## 2013-01-11 LAB — BASIC METABOLIC PANEL WITH GFR
Calcium: 8.7 mg/dL (ref 8.4–10.5)
Creat: 1.49 mg/dL — ABNORMAL HIGH (ref 0.50–1.10)
GFR, Est African American: 35 mL/min — ABNORMAL LOW
Sodium: 140 mEq/L (ref 135–145)

## 2013-01-12 ENCOUNTER — Other Ambulatory Visit: Payer: Self-pay | Admitting: Internal Medicine

## 2013-01-12 ENCOUNTER — Ambulatory Visit (INDEPENDENT_AMBULATORY_CARE_PROVIDER_SITE_OTHER): Payer: Medicare Other | Admitting: *Deleted

## 2013-01-12 DIAGNOSIS — I4891 Unspecified atrial fibrillation: Secondary | ICD-10-CM

## 2013-01-12 DIAGNOSIS — I495 Sick sinus syndrome: Secondary | ICD-10-CM

## 2013-01-12 LAB — PACEMAKER DEVICE OBSERVATION
ATRIAL PACING PM: 100
BAMS-0001: 150 {beats}/min
BAMS-0002: 0 ms
DEVICE MODEL PM: 313900

## 2013-01-12 NOTE — Progress Notes (Signed)
Pacemaker check in clinic. Normal device function. Battery longevity 1.5 yrs. ROV in 6 months w/JA in [place] office.

## 2013-01-13 ENCOUNTER — Other Ambulatory Visit: Payer: Self-pay | Admitting: Family Medicine

## 2013-01-16 ENCOUNTER — Telehealth: Payer: Self-pay | Admitting: *Deleted

## 2013-01-16 NOTE — Telephone Encounter (Signed)
Pt's daughter in law notified of result

## 2013-01-16 NOTE — Telephone Encounter (Signed)
Message copied by Bearl Mulberry on Tue Jan 16, 2013  8:38 AM ------      Message from: Ernestina Penna      Created: Thu Jan 11, 2013  1:51 PM       Electrolytes and potassium are good. Creatinine is slightly elevated at 1.49.      BNP is elevated at 1097----this is higher than before.      TSH is within normal limits            Please call her daughter-in-law with these result       ------

## 2013-01-22 ENCOUNTER — Ambulatory Visit (INDEPENDENT_AMBULATORY_CARE_PROVIDER_SITE_OTHER): Payer: Medicare Other | Admitting: Pharmacist

## 2013-01-22 DIAGNOSIS — I4891 Unspecified atrial fibrillation: Secondary | ICD-10-CM

## 2013-01-22 DIAGNOSIS — D649 Anemia, unspecified: Secondary | ICD-10-CM

## 2013-01-22 LAB — POCT INR: INR: 2

## 2013-01-22 NOTE — Patient Instructions (Addendum)
Anticoagulation Dose Instructions as of 01/22/2013     Glynis Smiles Tue Wed Thu Fri Sat   New Dose 3 mg 3 mg 3 mg 3 mg 3 mg 1.5 mg 3 mg    Description       Continue 3mg  daily except 1/2 tablet on Fridays.       INR was 2.0 today  Vitamin B12 Injections Every person needs vitamin B12. A deficiency develops when the body does not get enough of it. One way to overcome this is by getting B12 shots (injections). A B12 shot puts the vitamin directly into muscle tissue. This avoids any problems your body might have in absorbing it from food or a pill. In some people, the body has trouble using the vitamin correctly. This can cause a B12 deficiency. Not consuming enough of the vitamin can also cause a deficiency. Getting enough vitamin B12 can be hard for elderly people. Sometimes, they do not eat a well-balanced diet. The elderly are also more likely than younger people to have medical conditions or take medications that can lead to a deficiency. WHAT DOES VITAMIN B12 DO? Vitamin B12 does many things to help the body work right:  It helps the body make healthy red blood cells.  It helps maintain nerve cells.  It is involved in the body's process of converting food into energy (metabolism).  It is needed to make the genetic material in all cells (DNA). VITAMIN B12 FOOD SOURCES Most people get plenty of vitamin B12 through the foods they eat. It is present in:  Meat, fish, poultry, and eggs.  Milk and milk products.  It also is added when certain foods are made, including some breads, cereals and yogurts. The food is then called "fortified". CAUSES The most common causes of vitamin B12 deficiency are:  Pernicious anemia. The condition develops when the body cannot make enough healthy red blood cells. This stems from a lack of a protein made in the stomach (intrinsic factor). People without this protein cannot absorb enough vitamin B12 from food.  Malabsorption. This is when the body cannot  absorb the vitamin. It can be caused by:  Pernicious anemia.  Surgery to remove part or all of the stomach can lead to malabsorption. Removal of part or all of the small intestine can also cause malabsorption.  Vegetarian diet. People who are strict about not eating foods from animals could have trouble taking in enough vitamin B12 from diet alone.  Medications. Some medicines have been linked to B12 deficiency, such as Metformin (a drug prescribed for type 2 diabetes). Long-term use of stomach acid suppressants also can keep the vitamin from being absorbed.  Intestinal problems such as inflammatory bowel disease. If there are problems in the digestive tract, vitamin B12 may not be absorbed in good enough amounts. SYMPTOMS People who do not get enough B12 can develop problems. These can include:  Anemia. This is when the body has too few red blood cells. Red blood cells carry oxygen to the rest of the body. Without a healthy supply of red blood cells, people can feel:  Tired (fatigued).  Weak.  Severe anemia can cause:  Shortness of breath.  Dizziness.  Rapid heart rate.  Paleness.  Other Vitamin B12 deficiency symptoms include:  Diarrhea.  Numbness or tingling in the hands or feet.  Loss of appetite.  Confusion.  Sores on the tongue or in the mouth. LET YOUR CAREGIVER KNOW ABOUT:  Any allergies. It is very important to  know if you are allergic or sensitive to cobalt. Vitamin B12 contains cobalt.  Any history of kidney disease.  All medications you are taking. Include prescription and over-the-counter medicines, herbs and creams.  Whether you are pregnant or breast-feeding.  If you have Leber's disease, a hereditary eye condition, vitamin B12 could make it worse. RISKS AND COMPLICATIONS Reactions to an injection are usually temporary. They might include:  Pain at the injection site.  Redness, swelling or tenderness at the site.  Headache, dizziness or  weakness.  Nausea, upset stomach or diarrhea.  Numbness or tingling.  Fever.  Joint pain.  Itching or rash. If a reaction does not go away in a short while, talk with your healthcare provider. A change in the way the shots are given, or where they are given, might need to be made. BEFORE AN INJECTION To decide whether B12 injections are right for you, your healthcare provider will probably:  Ask about your medical history.  Ask questions about your diet.  Ask about symptoms such as:  Have you felt weak?  Do you feel unusually tired?  Do you get dizzy?  Order blood tests. These may include a test to:  Check the level of red cells in your blood.  Measure B12 levels.  Check for the presence of intrinsic factor. VITAMIN B12 INJECTIONS How often you will need a vitamin B12 injection will depend on how severe your deficiency is. This also will affect how long you will need to get them. People with pernicious anemia usually get injections for their entire life. Others might get them for a shorter period. For many people, injections are given daily or weekly for several weeks. Then, once B12 levels are normal, injections are given just once a month. If the cause of the deficiency can be fixed, the injections can be stopped. Talk with your healthcare provider about what you should expect. For an injection:  The injection site will be cleaned with an alcohol swab.  Your healthcare provider will insert a needle directly into a muscle. Most any muscle can be used. Most often, an arm muscle is used. A buttocks muscle can also be used. Many people say shots in that area are less painful.  A small adhesive bandage may be put over the injection site. It usually can be taken off in an hour or less. Injections can be given by your healthcare provider. In some cases, family members give them. Sometimes, people give them to themselves. Talk with your healthcare provider about what would be  best for you. If someone other than your healthcare provider will be giving the shots, the person will need to be trained to give them correctly. HOME CARE INSTRUCTIONS   You can remove the adhesive bandage within an hour of getting a shot.  You should be able to go about your normal activities right away.  Avoid drinking large amounts of alcohol while taking vitamin B12 shots. Alcohol can interfere with the body's use of the vitamin. SEEK MEDICAL CARE IF:   Pain, redness, swelling or tenderness at the injection site does not get better or gets worse.  Headache, dizziness or weakness does not go away.  You develop a fever of more than 100.5 F (38.1 C). SEEK IMMEDIATE MEDICAL CARE IF:   You have chest pain.  You develop shortness of breath.  You have muscle weakness that gets worse.  You develop numbness, weakness or tingling on one side or one area of the body.  You have symptoms of an allergic reaction, such as:  Hives.  Difficulty breathing.  Swelling of the lips, face, tongue or throat.  You develop a fever of more than 102.0 F (38.9 C). MAKE SURE YOU:   Understand these instructions.  Will watch your condition.  Will get help right away if you are not doing well or get worse. Document Released: 11/19/2008 Document Revised: 11/15/2011 Document Reviewed: 11/19/2008 [facility] Patient Information 2013 [place], [place].

## 2013-01-26 ENCOUNTER — Other Ambulatory Visit (INDEPENDENT_AMBULATORY_CARE_PROVIDER_SITE_OTHER): Payer: Medicare Other

## 2013-01-26 DIAGNOSIS — Z1212 Encounter for screening for malignant neoplasm of rectum: Secondary | ICD-10-CM

## 2013-01-27 LAB — FECAL OCCULT BLOOD, IMMUNOCHEMICAL: Fecal Occult Blood: NEGATIVE

## 2013-02-26 ENCOUNTER — Ambulatory Visit (INDEPENDENT_AMBULATORY_CARE_PROVIDER_SITE_OTHER): Payer: Medicare Other | Admitting: Pharmacist

## 2013-02-26 DIAGNOSIS — I4891 Unspecified atrial fibrillation: Secondary | ICD-10-CM

## 2013-02-26 DIAGNOSIS — D649 Anemia, unspecified: Secondary | ICD-10-CM

## 2013-02-26 DIAGNOSIS — E538 Deficiency of other specified B group vitamins: Secondary | ICD-10-CM

## 2013-02-26 NOTE — Patient Instructions (Signed)
Anticoagulation Dose Instructions as of 02/26/2013     [name] Tue Wed Thu Fri Sat   New Dose 3 mg 3 mg 3 mg 3 mg 3 mg 1.5 mg 3 mg    Description       4.5mg  today [=1 and 1/2], then resume 1 tablet daily except 1/2 on fridays       INR was 1.9 today

## 2013-03-01 ENCOUNTER — Ambulatory Visit: Payer: Medicare Other | Admitting: Family Medicine

## 2013-03-01 ENCOUNTER — Ambulatory Visit (INDEPENDENT_AMBULATORY_CARE_PROVIDER_SITE_OTHER): Payer: Medicare Other | Admitting: Family Medicine

## 2013-03-01 ENCOUNTER — Encounter: Payer: Self-pay | Admitting: Internal Medicine

## 2013-03-01 ENCOUNTER — Encounter: Payer: Self-pay | Admitting: Family Medicine

## 2013-03-01 VITALS — BP 130/68 | HR 71 | Temp 97.2°F | Wt 126.6 lb

## 2013-03-01 DIAGNOSIS — R609 Edema, unspecified: Secondary | ICD-10-CM

## 2013-03-01 DIAGNOSIS — T148XXA Other injury of unspecified body region, initial encounter: Secondary | ICD-10-CM

## 2013-03-01 DIAGNOSIS — IMO0002 Reserved for concepts with insufficient information to code with codable children: Secondary | ICD-10-CM

## 2013-03-01 MED ORDER — FUROSEMIDE 40 MG PO TABS: ORAL_TABLET | ORAL | Status: DC

## 2013-03-01 NOTE — Patient Instructions (Signed)
Edema  Edema is an abnormal build-up of fluids in tissues. Because this is partly dependent on gravity (water flows to the lowest place), it is more common in the legs and thighs (lower extremities). It is also common in the looser tissues, like around the eyes. Painless swelling of the feet and ankles is common and increases as a person ages. It may affect both legs and may include the calves or even thighs. When squeezed, the fluid may move out of the affected area and may leave a dent for a few moments.  CAUSES    Prolonged standing or sitting in one place for extended periods of time. Movement helps pump tissue fluid into the veins, and absence of movement prevents this, resulting in edema.   Varicose veins. The valves in the veins do not work as well as they should. This causes fluid to leak into the tissues.   Fluid and salt overload.   Injury, burn, or surgery to the leg, ankle, or foot, may damage veins and allow fluid to leak out.   Sunburn damages vessels. Leaky vessels allow fluid to go out into the sunburned tissues.   Allergies (from insect bites or stings, medications or chemicals) cause swelling by allowing vessels to become leaky.   Protein in the blood helps keep fluid in your vessels. Low protein, as in malnutrition, allows fluid to leak out.   Hormonal changes, including pregnancy and menstruation, cause fluid retention. This fluid may leak out of vessels and cause edema.   Medications that cause fluid retention. Examples are sex hormones, blood pressure medications, steroid treatment, or anti-depressants.   Some illnesses cause edema, especially heart failure, kidney disease, or liver disease.   Surgery that cuts veins or lymph nodes, such as surgery done for the heart or for breast cancer, may result in edema.  DIAGNOSIS   Your caregiver is usually easily able to determine what is causing your swelling (edema) by simply asking what is wrong (getting a history) and examining you (doing  a physical). Sometimes x-rays, EKG (electrocardiogram or heart tracing), and blood work may be done to evaluate for underlying medical illness.  TREATMENT   General treatment includes:   Leg elevation (or elevation of the affected body part).   Restriction of fluid intake.   Prevention of fluid overload.   Compression of the affected body part. Compression with elastic bandages or support stockings squeezes the tissues, preventing fluid from entering and forcing it back into the blood vessels.   Diuretics (also called water pills or fluid pills) pull fluid out of your body in the form of increased urination. These are effective in reducing the swelling, but can have side effects and must be used only under your caregiver's supervision. Diuretics are appropriate only for some types of edema.  The specific treatment can be directed at any underlying causes discovered. Heart, liver, or kidney disease should be treated appropriately.  HOME CARE INSTRUCTIONS    Elevate the legs (or affected body part) above the level of the heart, while lying down.   Avoid sitting or standing still for prolonged periods of time.   Avoid putting anything directly under the knees when lying down, and do not wear constricting clothing or garters on the upper legs.   Exercising the legs causes the fluid to work back into the veins and lymphatic channels. This may help the swelling go down.   The pressure applied by elastic bandages or support stockings can help reduce ankle swelling.     A low-salt diet may help reduce fluid retention and decrease the ankle swelling.   Take any medications exactly as prescribed.  SEEK MEDICAL CARE IF:   Your edema is not responding to recommended treatments.  SEEK IMMEDIATE MEDICAL CARE IF:    You develop shortness of breath or chest pain.   You cannot breathe when you lay down; or if, while lying down, you have to get up and go to the window to get your breath.   You are having increasing  swelling without relief from treatment.   You develop a fever over 102 F (38.9 C).   You develop pain or redness in the areas that are swollen.   Tell your caregiver right away if you have gained 3 lb/1.4 kg in 1 day or 5 lb/2.3 kg in a week.  MAKE SURE YOU:    Understand these instructions.   Will watch your condition.   Will get help right away if you are not doing well or get worse.  Document Released: 08/23/2005 Document Revised: 02/22/2012 Document Reviewed: 04/10/2008  [employer] Patient Information 2014 [employer].

## 2013-03-01 NOTE — Progress Notes (Signed)
  Subjective:    Patient ID: [name], female    DOB: 05-22-1918, 79 y.o.   MRN: [id number]  HPI  This 79 y.o. female presents for evaluation of skin tear left leg.  She has edema in her bilateral LE's and she was putting on her TEDS and she had a skin tear and has been bleeding in her left leg.  She is accompanied by Family and Dr. Christell Constant has seen and talked with her and the family.  Review of Systems No chest pain, SOB, HA, dizziness, vision change, N/V, diarrhea, constipation, dysuria, urinary urgency or frequency, myalgias, arthralgias or rash.     Objective:   Physical Exam Vital signs noted  Well developed well nourished 79 y/o female.  HEENT - Head atraumatic Normocephalic Respiratory - Lungs CTA bilateral Cardiac - RRR S1 and S2 without murmur Extremities - Bilateral LE edema,  Left leg with skin tear. Neuro - Grossly intact.       Assessment & Plan:  Edema - Plan: furosemide (LASIX) 40 MG tablet, BASIC METABOLIC PANEL WITH GFR in 4 days.  Skin tear - Plan: furosemide (LASIX) 40 MG tablet bilateral for 3 days and this will decrease edema and allow for healing of skin tear.  Clean and apply triple abx cream to skin tear and non adaptic dressing.  Follow up prn

## 2013-03-05 ENCOUNTER — Other Ambulatory Visit (INDEPENDENT_AMBULATORY_CARE_PROVIDER_SITE_OTHER): Payer: Medicare Other

## 2013-03-05 DIAGNOSIS — R609 Edema, unspecified: Secondary | ICD-10-CM

## 2013-03-06 LAB — BASIC METABOLIC PANEL WITH GFR
BUN: 29 mg/dL — ABNORMAL HIGH (ref 6–23)
CO2: 31 mEq/L (ref 19–32)
Calcium: 8.7 mg/dL (ref 8.4–10.5)
Chloride: 99 mEq/L (ref 96–112)
Creat: 2.13 mg/dL — ABNORMAL HIGH (ref 0.50–1.10)
GFR, Est African American: 22 mL/min — ABNORMAL LOW
GFR, Est Non African American: 20 mL/min — ABNORMAL LOW
Glucose, Bld: 84 mg/dL (ref 70–99)
Potassium: 4.6 mEq/L (ref 3.5–5.3)
Sodium: 138 mEq/L (ref 135–145)

## 2013-03-08 ENCOUNTER — Other Ambulatory Visit: Payer: Self-pay | Admitting: Family Medicine

## 2013-03-14 ENCOUNTER — Other Ambulatory Visit (INDEPENDENT_AMBULATORY_CARE_PROVIDER_SITE_OTHER): Payer: Medicare Other

## 2013-03-14 DIAGNOSIS — R7989 Other specified abnormal findings of blood chemistry: Secondary | ICD-10-CM

## 2013-03-14 NOTE — Progress Notes (Signed)
Patient came in for labs only.

## 2013-03-15 LAB — BASIC METABOLIC PANEL WITH GFR
CO2: 32 mEq/L (ref 19–32)
Calcium: 9.3 mg/dL (ref 8.4–10.5)
Chloride: 97 mEq/L (ref 96–112)
Creat: 1.45 mg/dL — ABNORMAL HIGH (ref 0.50–1.10)
Glucose, Bld: 93 mg/dL (ref 70–99)

## 2013-03-22 ENCOUNTER — Ambulatory Visit: Payer: Medicare Other | Admitting: Family Medicine

## 2013-03-22 ENCOUNTER — Other Ambulatory Visit: Payer: Self-pay | Admitting: Family Medicine

## 2013-03-29 ENCOUNTER — Ambulatory Visit: Payer: Medicare Other | Admitting: Pharmacist

## 2013-03-29 ENCOUNTER — Encounter: Payer: Self-pay | Admitting: Family Medicine

## 2013-03-29 ENCOUNTER — Ambulatory Visit (INDEPENDENT_AMBULATORY_CARE_PROVIDER_SITE_OTHER): Payer: Medicare Other | Admitting: Family Medicine

## 2013-03-29 VITALS — BP 110/56 | HR 60 | Temp 97.3°F | Ht 62.25 in | Wt 120.0 lb

## 2013-03-29 DIAGNOSIS — I10 Essential (primary) hypertension: Secondary | ICD-10-CM

## 2013-03-29 DIAGNOSIS — I4891 Unspecified atrial fibrillation: Secondary | ICD-10-CM

## 2013-03-29 DIAGNOSIS — Z7901 Long term (current) use of anticoagulants: Secondary | ICD-10-CM

## 2013-03-29 DIAGNOSIS — R5381 Other malaise: Secondary | ICD-10-CM

## 2013-03-29 DIAGNOSIS — D649 Anemia, unspecified: Secondary | ICD-10-CM

## 2013-03-29 DIAGNOSIS — E538 Deficiency of other specified B group vitamins: Secondary | ICD-10-CM

## 2013-03-29 LAB — POCT INR: INR: 1.8

## 2013-03-29 MED ORDER — ISOSORBIDE MONONITRATE ER 30 MG PO TB24: 15.0000 mg | ORAL_TABLET | Freq: Every day | ORAL | Status: DC

## 2013-03-29 MED ORDER — PROMETHAZINE HCL 25 MG PO TABS: 25.0000 mg | ORAL_TABLET | Freq: Four times a day (QID) | ORAL | Status: DC | PRN

## 2013-03-29 NOTE — Patient Instructions (Signed)
You are doing well overall.  There is no lasting injury from your fall Some of your symptoms are coming from having a low blood pressure Please start taking only a half of an Imdur daily. I have sent a new prescription to the pharmacy Please take all of your other medications as prescribed.  Please come back in 2 weeks for a check up with Dr. Christell Constant Please go to the physical therapist to help with your strength.

## 2013-03-29 NOTE — Assessment & Plan Note (Addendum)
79 yo female w/ multiple recent mechanical falls and professed increasing muscle weakness.  Would benefit greatly from PT - referral placed

## 2013-03-29 NOTE — Patient Instructions (Signed)
Anticoagulation Dose Instructions as of 03/29/2013     [name] Tue Wed Thu Fri Sat   New Dose 3 mg 3 mg 3 mg 3 mg 3 mg 3 mg 3 mg    Description       Start 1 tablet daily      INR was 1.8 today

## 2013-03-29 NOTE — Progress Notes (Signed)
  Subjective:    Patient ID: [name], female    DOB: 01-Sep-1918, 79 y.o.   MRN: [id number]  Fall Associated symptoms include nausea. Pertinent negatives include no fever, headaches or numbness.    Fall last Sunday. Larey Seat while trying to leave the bathroom. Hit head on shower door.  Struck bottom on floor. Required assistance to get back up. Denies any LOC, syncope, szr, tongue biting, dizziness. Endorses weak legs and needing a 4 walker for ambulation. Has felt a little more confused this past week. Feels most lightheaeded and weak when going from sitting to standing.  HTN/Afib: takes isosorbide, Amiodorone, Dilt, and metop.   Nausea: Taking promethazine PRN w/ excellent control. Ran out and needs refill.   Pt is a non-smoker. PMHx and FMHx updated as appropriate.  Review of Systems  Constitutional: Negative for fever, activity change and fatigue.  HENT: Negative for neck pain and neck stiffness.   Respiratory: Negative for shortness of breath.   Cardiovascular: Negative for chest pain, palpitations and leg swelling.  Gastrointestinal: Positive for nausea.  Neurological: Positive for weakness and light-headedness. Negative for dizziness, tremors, seizures, syncope, facial asymmetry, speech difficulty, numbness and headaches.       Objective:   Physical Exam  Constitutional: She is oriented to person, place, and time. She appears well-developed and well-nourished. No distress.  HENT:  Head: Normocephalic and atraumatic.  Eyes: EOM are normal. Pupils are equal, round, and reactive to light.  Neck: Normal range of motion.  Cardiovascular: Normal rate and intact distal pulses.   Murmur heard. Pulmonary/Chest: Effort normal and breath sounds normal. No respiratory distress.  Abdominal: Soft. She exhibits no distension.  Musculoskeletal: Normal range of motion. She exhibits edema. She exhibits no tenderness.  Neurological: She is alert and oriented to person, place, and time. No  cranial nerve deficit. Coordination normal.  Skin: Skin is warm and dry. No rash noted. She is not diaphoretic. No erythema.  Psychiatric: She has a normal mood and affect. Her behavior is normal. Thought content normal.    BP 110/56  Pulse 60  Temp(Src) 97.3 F (36.3 C) (Oral)  Ht 5' 2.25" (1.581 m)  Wt 120 lb (54.432 kg)  BMI 21.78 kg/m2       Assessment & Plan:  79yo F w/ mechanical fall s/p likely orthostatic event. And chronic nausea well controlled w/ PHenergan - Refill PHenergan - Will decrease isosorbide today based on SBP of 110 (flowsheet of all previous BPs well below max recommended). Will continue amiodarone, dilt and metop based on h/o Afib w/ RVR.  - Pt would do well to start physical therapy to assist w/ phycsical deconditioning.   Shelly Flatten, MD Family Medicine PGY-3 03/29/2013, 4:02 PM

## 2013-03-29 NOTE — Assessment & Plan Note (Signed)
Pt w/ significant cardiac hx most significantly for HTN, Afib w/ apparent RVR, and possible sick sinus syndrome.  SBP low today in clinic and describing multiple orthostatic near falls at home.  Will not make changes at this time to rate controlling medicaitons (amio, Dilt, Metop) Will half imdur at this time w/ close follow up with PCP.

## 2013-04-09 ENCOUNTER — Other Ambulatory Visit: Payer: Self-pay | Admitting: Family Medicine

## 2013-04-16 ENCOUNTER — Other Ambulatory Visit: Payer: Self-pay | Admitting: Family Medicine

## 2013-04-30 ENCOUNTER — Ambulatory Visit (INDEPENDENT_AMBULATORY_CARE_PROVIDER_SITE_OTHER): Payer: Medicare Other | Admitting: Pharmacist

## 2013-04-30 DIAGNOSIS — E538 Deficiency of other specified B group vitamins: Secondary | ICD-10-CM

## 2013-04-30 DIAGNOSIS — I4891 Unspecified atrial fibrillation: Secondary | ICD-10-CM

## 2013-04-30 MED ORDER — CYANOCOBALAMIN 1000 MCG/ML IJ SOLN
1000.0000 ug | INTRAMUSCULAR | Status: DC
  Administered 2013-04-30: 1000 ug via INTRAMUSCULAR

## 2013-04-30 NOTE — Patient Instructions (Addendum)
Anticoagulation Dose Instructions as of 04/30/2013     [name] Tue Wed Thu Fri Sat   New Dose 3 mg 3 mg 3 mg 1.5 mg 3 mg 3 mg 3 mg    Description       No warfarin tomorrow then start 1 tablet daily except 1/2 tablet on wednesdays.       INR was 3.4 todayCyanocobalamin, Vitamin B12 injection What is this medicine? CYANOCOBALAMIN (sye an oh koe BAL a min) is a man made form of vitamin B12. Vitamin B12 is used in the growth of healthy blood cells, nerve cells, and proteins in the body. It also helps with the metabolism of fats and carbohydrates. This medicine is used to treat people who can not absorb vitamin B12. This medicine may be used for other purposes; ask your health care provider or pharmacist if you have questions. What should I tell my health care provider before I take this medicine? They need to know if you have any of these conditions: -kidney disease -Leber's disease -megaloblastic anemia -an unusual or allergic reaction to cyanocobalamin, cobalt, other medicines, foods, dyes, or preservatives -pregnant or trying to get pregnant -breast-feeding How should I use this medicine? This medicine is injected into a muscle or deeply under the skin. It is usually given by a health care professional in a clinic or doctor's office. However, your doctor may teach you how to inject yourself. Follow all instructions. Talk to your pediatrician regarding the use of this medicine in children. Special care may be needed. Overdosage: If you think you have taken too much of this medicine contact a poison control center or emergency room at once. NOTE: This medicine is only for you. Do not share this medicine with others. What if I miss a dose? If you are given your dose at a clinic or doctor's office, call to reschedule your appointment. If you give your own injections and you miss a dose, take it as soon as you can. If it is almost time for your next dose, take only that dose. Do not take  double or extra doses. What may interact with this medicine? -colchicine -heavy alcohol intake This list may not describe all possible interactions. Give your health care provider a list of all the medicines, herbs, non-prescription drugs, or dietary supplements you use. Also tell them if you smoke, drink alcohol, or use illegal drugs. Some items may interact with your medicine. What should I watch for while using this medicine? Visit your doctor or health care professional regularly. You may need blood work done while you are taking this medicine. You may need to follow a special diet. Talk to your doctor. Limit your alcohol intake and avoid smoking to get the best benefit. What side effects may I notice from receiving this medicine? Side effects that you should report to your doctor or health care professional as soon as possible: -allergic reactions like skin rash, itching or hives, swelling of the face, lips, or tongue -blue tint to skin -chest tightness, pain -difficulty breathing, wheezing -dizziness -red, swollen painful area on the leg Side effects that usually do not require medical attention (report to your doctor or health care professional if they continue or are bothersome): -diarrhea -headache This list may not describe all possible side effects. Call your doctor for medical advice about side effects. You may report side effects to FDA at 1-800-FDA-1088. Where should I keep my medicine? Keep out of the reach of children. Store at room  temperature between 15 and 30 degrees C (59 and 85 degrees F). Protect from light. Throw away any unused medicine after the expiration date. NOTE: This sheet is a summary. It may not cover all possible information. If you have questions about this medicine, talk to your doctor, pharmacist, or health care provider.  2012, [employer]. (12/04/2007 10:10:20 PM)

## 2013-04-30 NOTE — Progress Notes (Signed)
VitB12 given Pt tolerated well.

## 2013-05-08 DIAGNOSIS — I495 Sick sinus syndrome: Secondary | ICD-10-CM

## 2013-05-09 ENCOUNTER — Ambulatory Visit (INDEPENDENT_AMBULATORY_CARE_PROVIDER_SITE_OTHER): Payer: Medicare Other | Admitting: Family Medicine

## 2013-05-09 ENCOUNTER — Encounter (HOSPITAL_COMMUNITY): Payer: Self-pay

## 2013-05-09 ENCOUNTER — Emergency Department (HOSPITAL_COMMUNITY): Payer: Medicare Other

## 2013-05-09 ENCOUNTER — Inpatient Hospital Stay (HOSPITAL_COMMUNITY)
Admission: EM | Admit: 2013-05-09 | Discharge: 2013-05-11 | DRG: 866 | Disposition: A | Discharge status: Home Health | Payer: Medicare Other | Attending: Family Medicine | Admitting: Family Medicine

## 2013-05-09 ENCOUNTER — Telehealth: Payer: Self-pay | Admitting: Family Medicine

## 2013-05-09 VITALS — BP 135/67 | HR 59 | Temp 96.5°F | Ht 62.5 in | Wt 120.0 lb

## 2013-05-09 DIAGNOSIS — B028 Zoster with other complications: Principal | ICD-10-CM | POA: Diagnosis present

## 2013-05-09 DIAGNOSIS — Z87891 Personal history of nicotine dependence: Secondary | ICD-10-CM

## 2013-05-09 DIAGNOSIS — I2510 Atherosclerotic heart disease of native coronary artery without angina pectoris: Secondary | ICD-10-CM | POA: Diagnosis present

## 2013-05-09 DIAGNOSIS — D649 Anemia, unspecified: Secondary | ICD-10-CM | POA: Diagnosis present

## 2013-05-09 DIAGNOSIS — R29810 Facial weakness: Secondary | ICD-10-CM

## 2013-05-09 DIAGNOSIS — G510 Bell's palsy: Secondary | ICD-10-CM | POA: Diagnosis present

## 2013-05-09 DIAGNOSIS — E538 Deficiency of other specified B group vitamins: Secondary | ICD-10-CM | POA: Diagnosis present

## 2013-05-09 DIAGNOSIS — M1990 Unspecified osteoarthritis, unspecified site: Secondary | ICD-10-CM | POA: Diagnosis present

## 2013-05-09 DIAGNOSIS — Z950 Presence of cardiac pacemaker: Secondary | ICD-10-CM

## 2013-05-09 DIAGNOSIS — Z8673 Personal history of transient ischemic attack (TIA), and cerebral infarction without residual deficits: Secondary | ICD-10-CM

## 2013-05-09 DIAGNOSIS — I4891 Unspecified atrial fibrillation: Secondary | ICD-10-CM

## 2013-05-09 DIAGNOSIS — Z90710 Acquired absence of both cervix and uterus: Secondary | ICD-10-CM

## 2013-05-09 DIAGNOSIS — I495 Sick sinus syndrome: Secondary | ICD-10-CM | POA: Diagnosis present

## 2013-05-09 DIAGNOSIS — E039 Hypothyroidism, unspecified: Secondary | ICD-10-CM | POA: Diagnosis present

## 2013-05-09 DIAGNOSIS — R1310 Dysphagia, unspecified: Secondary | ICD-10-CM | POA: Diagnosis present

## 2013-05-09 DIAGNOSIS — J029 Acute pharyngitis, unspecified: Secondary | ICD-10-CM

## 2013-05-09 DIAGNOSIS — B0089 Other herpesviral infection: Secondary | ICD-10-CM

## 2013-05-09 DIAGNOSIS — M48061 Spinal stenosis, lumbar region without neurogenic claudication: Secondary | ICD-10-CM | POA: Diagnosis present

## 2013-05-09 LAB — BASIC METABOLIC PANEL WITH GFR
BUN: 23 mg/dL (ref 6–23)
CO2: 31 meq/L (ref 19–32)
Calcium: 9 mg/dL (ref 8.4–10.5)
Chloride: 99 meq/L (ref 96–112)
Creatinine, Ser: 1.09 mg/dL (ref 0.50–1.10)
GFR calc Af Amer: 49 mL/min — ABNORMAL LOW
GFR calc non Af Amer: 42 mL/min — ABNORMAL LOW
Glucose, Bld: 98 mg/dL (ref 70–99)
Potassium: 3.9 meq/L (ref 3.5–5.1)
Sodium: 138 meq/L (ref 135–145)

## 2013-05-09 LAB — CBC WITH DIFFERENTIAL/PLATELET
Basophils Absolute: 0 K/uL (ref 0.0–0.1)
Basophils Relative: 1 % (ref 0–1)
Eosinophils Absolute: 0.1 K/uL (ref 0.0–0.7)
Eosinophils Relative: 1 % (ref 0–5)
HCT: 36.3 % (ref 36.0–46.0)
Hemoglobin: 11.7 g/dL — ABNORMAL LOW (ref 12.0–15.0)
Lymphocytes Relative: 20 % (ref 12–46)
Lymphs Abs: 1.1 K/uL (ref 0.7–4.0)
MCH: 28.5 pg (ref 26.0–34.0)
MCHC: 32.2 g/dL (ref 30.0–36.0)
MCV: 88.3 fL (ref 78.0–100.0)
Monocytes Absolute: 0.8 K/uL (ref 0.1–1.0)
Monocytes Relative: 13 % — ABNORMAL HIGH (ref 3–12)
Neutro Abs: 3.8 K/uL (ref 1.7–7.7)
Neutrophils Relative %: 65 % (ref 43–77)
Platelets: 158 K/uL (ref 150–400)
RBC: 4.11 MIL/uL (ref 3.87–5.11)
RDW: 15.5 % (ref 11.5–15.5)
WBC: 5.8 K/uL (ref 4.0–10.5)

## 2013-05-09 LAB — PROTIME-INR
INR: 2.18 — ABNORMAL HIGH (ref 0.00–1.49)
Prothrombin Time: 23.6 s — ABNORMAL HIGH (ref 11.6–15.2)

## 2013-05-09 LAB — TROPONIN I: Troponin I: <0.3 ng/mL

## 2013-05-09 MED ORDER — SODIUM CHLORIDE 0.9 % IJ SOLN: 3.0000 mL | INTRAMUSCULAR | Status: DC | PRN

## 2013-05-09 MED ORDER — SODIUM CHLORIDE 0.9 % IV SOLN: 250.0000 mL | INTRAVENOUS | Status: DC | PRN

## 2013-05-09 MED ORDER — HYDROMORPHONE HCL PF 1 MG/ML IJ SOLN
0.5000 mg | INTRAMUSCULAR | Status: DC | PRN
  Administered 2013-05-10 (×3): 0.5 mg via INTRAVENOUS
  Filled 2013-05-09 (×3): qty 1

## 2013-05-09 MED ORDER — OXYCODONE HCL 5 MG PO TABS: 5.0000 mg | ORAL_TABLET | ORAL | Status: DC | PRN

## 2013-05-09 MED ORDER — AMIODARONE HCL 200 MG PO TABS
200.0000 mg | ORAL_TABLET | Freq: Every day | ORAL | Status: DC
  Administered 2013-05-10 – 2013-05-11 (×2): 200 mg via ORAL
  Filled 2013-05-09 (×2): qty 1

## 2013-05-09 MED ORDER — FUROSEMIDE 20 MG PO TABS
20.0000 mg | ORAL_TABLET | Freq: Two times a day (BID) | ORAL | Status: DC
  Administered 2013-05-10 – 2013-05-11 (×3): 20 mg via ORAL
  Filled 2013-05-09 (×3): qty 1

## 2013-05-09 MED ORDER — WARFARIN - PHARMACIST DOSING INPATIENT: Status: DC

## 2013-05-09 MED ORDER — METOPROLOL TARTRATE 50 MG PO TABS
50.0000 mg | ORAL_TABLET | Freq: Two times a day (BID) | ORAL | Status: DC
  Administered 2013-05-10 – 2013-05-11 (×4): 50 mg via ORAL
  Filled 2013-05-09 (×4): qty 1

## 2013-05-09 MED ORDER — ACYCLOVIR SODIUM 50 MG/ML IV SOLN
250.0000 mg | Freq: Once | INTRAVENOUS | Status: AC
  Administered 2013-05-09: 250 mg via INTRAVENOUS
  Filled 2013-05-09: qty 5

## 2013-05-09 MED ORDER — ACYCLOVIR SODIUM 50 MG/ML IV SOLN
INTRAVENOUS | Status: AC
  Filled 2013-05-09: qty 10

## 2013-05-09 MED ORDER — DEXTROSE 5 % IV SOLN
250.0000 mg | Freq: Two times a day (BID) | INTRAVENOUS | Status: DC
  Administered 2013-05-10: 250 mg via INTRAVENOUS
  Filled 2013-05-09 (×3): qty 5

## 2013-05-09 MED ORDER — ONDANSETRON HCL 4 MG/2ML IJ SOLN
4.0000 mg | INTRAMUSCULAR | Status: DC | PRN
  Administered 2013-05-10: 4 mg via INTRAVENOUS
  Filled 2013-05-09: qty 2

## 2013-05-09 MED ORDER — NITROGLYCERIN 0.4 MG SL SUBL: 0.4000 mg | SUBLINGUAL_TABLET | SUBLINGUAL | Status: DC | PRN

## 2013-05-09 MED ORDER — MAGIC MOUTHWASH
ORAL | Status: AC
  Filled 2013-05-09: qty 5

## 2013-05-09 MED ORDER — LEVOTHYROXINE SODIUM 137 MCG PO TABS
137.0000 ug | ORAL_TABLET | Freq: Every day | ORAL | Status: DC
  Administered 2013-05-11: 137 ug via ORAL
  Filled 2013-05-09 (×5): qty 1

## 2013-05-09 MED ORDER — FLEET ENEMA 7-19 GM/118ML RE ENEM: 1.0000 | ENEMA | Freq: Once | RECTAL | Status: AC | PRN

## 2013-05-09 MED ORDER — WARFARIN SODIUM 2 MG PO TABS
3.0000 mg | ORAL_TABLET | ORAL | Status: AC
  Administered 2013-05-10: 3 mg via ORAL
  Filled 2013-05-09: qty 1

## 2013-05-09 MED ORDER — TRAZODONE HCL 50 MG PO TABS: 25.0000 mg | ORAL_TABLET | Freq: Every evening | ORAL | Status: DC | PRN

## 2013-05-09 MED ORDER — MORPHINE SULFATE 2 MG/ML IJ SOLN
2.0000 mg | Freq: Once | INTRAMUSCULAR | Status: AC
  Administered 2013-05-09: 2 mg via INTRAVENOUS
  Filled 2013-05-09: qty 1

## 2013-05-09 MED ORDER — MAGIC MOUTHWASH W/LIDOCAINE
10.0000 mL | Freq: Four times a day (QID) | ORAL | Status: DC | PRN
  Filled 2013-05-09: qty 10

## 2013-05-09 MED ORDER — SODIUM CHLORIDE 0.9 % IJ SOLN
3.0000 mL | Freq: Two times a day (BID) | INTRAMUSCULAR | Status: DC
  Administered 2013-05-09 – 2013-05-11 (×2): 3 mL via INTRAVENOUS

## 2013-05-09 MED ORDER — ISOSORBIDE MONONITRATE ER 30 MG PO TB24
15.0000 mg | ORAL_TABLET | Freq: Every day | ORAL | Status: DC
  Administered 2013-05-10 – 2013-05-11 (×2): 15 mg via ORAL
  Filled 2013-05-09 (×2): qty 1

## 2013-05-09 MED ORDER — POLYETHYLENE GLYCOL 3350 17 G PO PACK: 17.0000 g | PACK | Freq: Every day | ORAL | Status: DC | PRN

## 2013-05-09 MED ORDER — LEVOTHYROXINE SODIUM 25 MCG PO TABS
12.5000 ug | ORAL_TABLET | ORAL | Status: DC
  Administered 2013-05-11: 12.5 ug via ORAL
  Filled 2013-05-09: qty 1

## 2013-05-09 MED ORDER — ACETAMINOPHEN 160 MG/5ML PO SOLN: 650.0000 mg | Freq: Four times a day (QID) | ORAL | Status: DC | PRN

## 2013-05-09 MED ORDER — BISACODYL 10 MG RE SUPP: 10.0000 mg | Freq: Every day | RECTAL | Status: DC | PRN

## 2013-05-09 MED ORDER — DILTIAZEM HCL ER BEADS 240 MG PO CP24
360.0000 mg | ORAL_CAPSULE | Freq: Every day | ORAL | Status: DC
  Filled 2013-05-09 (×2): qty 1

## 2013-05-09 NOTE — ED Notes (Signed)
Pt states her chest pain has subsided at this time. Family made aware that the MD knows about chest pains & further labs ordered, MD will see pt on the floor.

## 2013-05-09 NOTE — Progress Notes (Signed)
Patient ID: [name], female   DOB: 19-Jun-1918, 79 y.o.   MRN: [id number] SUBJECTIVE: CC: Chief Complaint  Patient presents with  . face drawn and lip swollen    Started with sores in mouth on Sunday night  . Fatigue  . Altered Mental Status    EMS came out today    HPI: 79 year old elderly frail [name] who ambulates with a walker , has experienced facial drooping and soreness of the roof of the mouth for the past 3 days. Started with the soreness of the roof of the mouth as if she had sores and her tongue. This am early had facial drooping and the caretaker called EMS who evaluated her and gave her the option to be transported to the ED or for her to see the PCP or a neurologist. She claims to chose to see her PCP. Intermittent Headaches. Intermittent dizziness.brief confusion per the family/caretakers. Patient has a pacemaker and is on coumadin.  Past Medical History  Diagnosis Date  . Atrial fibrillation     paroxysmal  . OA (osteoarthritis)   . Gastritis   . B12 deficiency   . Anemia   . PUD (peptic ulcer disease)   . ASCVD (arteriosclerotic cardiovascular disease)   . Spinal stenosis of lumbosacral region   . Hypothyroid   . Tachycardia-bradycardia     s/p PPM (Guidant)  . CVA (cerebrovascular accident)   . Ovarian mass     benign    Past Surgical History  Procedure Laterality Date  . Lumbar disc surgery  1991  . Gastric ulcer surgery    . Total abdominal hysterectomy w/ bilateral salpingoophorectomy  10/03    ovarian cysts   . 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 [employer]    History   Social History  . Marital Status: Widowed    Spouse Name: N/A    Number of Children: N/A  . Years of Education: N/A   Occupational History  . Not on file.   Social History Main Topics  . Smoking status: Former Smoker -- 0.50 packs/day for 4 years    Types:  Cigarettes    Quit date: 09/06/1956  . Smokeless tobacco: Never Used  . Alcohol Use: No  . Drug Use: No  . Sexual Activity: Not on file   Other Topics Concern  . Not on file   Social History Narrative  . No narrative on file   Family History  Problem Relation Age of Onset  . Heart attack Mother   . Heart attack Father   . Cancer Sister   . Cancer Brother   . Cancer Brother    Current Outpatient Prescriptions on File Prior to Visit  Medication Sig Dispense Refill  . acetaminophen (TYLENOL) 500 MG tablet Take 500 mg by mouth every 6 (six) hours as needed.        Marland Kitchen amiodarone (PACERONE) 200 MG tablet Take 1 tablet (200 mg total) by mouth daily.  30 tablet  9  . Cholecalciferol (VITAMIN D) 2000 UNITS tablet Take 2,000 Units by mouth daily.        Marland Kitchen diltiazem (TIAZAC) 360 MG 24 hr capsule TAKE (1) CAPSULE DAILY  30 capsule  2  . furosemide (LASIX) 20 MG tablet TAKE 1 TABLET IN THE MORNING AND AT 4PM  60 tablet  4  . furosemide (LASIX) 40 MG tablet One po bid x  3 days for swelling along with current lasix  6 tablet  0  . HYDROcodone-acetaminophen (NORCO) 5-325 MG per tablet TAKE 1/2 TABLET TWICE DAILY AS NEEDED  30 tablet  0  . IRON PO Take 1 tablet by mouth daily.       . isosorbide mononitrate (IMDUR) 30 MG 24 hr tablet Take 0.5 tablets (15 mg total) by mouth daily.  30 tablet  5  . levothyroxine (SYNTHROID, LEVOTHROID) 137 MCG tablet TAKE 1 TABLET DAILY  30 tablet  2  . levothyroxine (SYNTHROID, LEVOTHROID) 25 MCG tablet TAKE 1/2 TABLET ON MONDAYS WEDNESDAYS & FRIDAYS ALONG WITH OTHER THYROID TABLET  10 tablet  8  . meclizine (ANTIVERT) 12.5 MG tablet Take 12.5 mg by mouth as needed.        . metoprolol (LOPRESSOR) 50 MG tablet Take 50 mg by mouth 2 (two) times daily.        . nitroGLYCERIN (NITROSTAT) 0.4 MG SL tablet Place 0.4 mg under the tongue every 5 (five) minutes as needed.        . promethazine (PHENERGAN) 25 MG tablet Take 1 tablet (25 mg total) by mouth every 6 (six)  hours as needed. Take half tablet by mouth as needed  30 tablet  3  . ranitidine (ZANTAC 75) 75 MG tablet Take 1 tablet (75 mg total) by mouth 2 (two) times daily.      . traMADol (ULTRAM) 50 MG tablet Take 50 mg by mouth 4 (four) times daily as needed.        . warfarin (COUMADIN) 3 MG tablet Take 1 tablet by mouth as directed.       Current Facility-Administered Medications on File Prior to Visit  Medication Dose Route Frequency Provider Last Rate Last Dose  . cyanocobalamin ((VITAMIN B-12)) injection 1,000 mcg  1,000 mcg Intramuscular Q30 days Tammy Eckard, PHARMD   1,000 mcg at 03/29/13 1515  . cyanocobalamin ((VITAMIN B-12)) injection 1,000 mcg  1,000 mcg Intramuscular Q30 days Ernestina Penna, MD   1,000 mcg at 04/30/13 1525   Allergies  Allergen Reactions  . Codeine Nausea Only    GI UPSET    There is no immunization history on file for this patient. Prior to Admission medications   Medication Sig Start Date End Date Taking? Authorizing Provider  acetaminophen (TYLENOL) 500 MG tablet Take 500 mg by mouth every 6 (six) hours as needed.     Yes Historical Provider, MD  amiodarone (PACERONE) 200 MG tablet Take 1 tablet (200 mg total) by mouth daily. 12/01/12  Yes Hillis Range, MD  Cholecalciferol (VITAMIN D) 2000 UNITS tablet Take 2,000 Units by mouth daily.     Yes Historical Provider, MD  diltiazem (TIAZAC) 360 MG 24 hr capsule TAKE (1) CAPSULE DAILY 04/09/13  Yes Ernestina Penna, MD  furosemide (LASIX) 20 MG tablet TAKE 1 TABLET IN THE MORNING AND AT 4PM 04/16/13  Yes Ernestina Penna, MD  furosemide (LASIX) 40 MG tablet One po bid x 3 days for swelling along with current lasix 03/01/13  Yes Deatra Canter, FNP  HYDROcodone-acetaminophen (NORCO) 5-325 MG per tablet TAKE 1/2 TABLET TWICE DAILY AS NEEDED 12/20/12  Yes Ernestina Penna, MD  IRON PO Take 1 tablet by mouth daily.    Yes Historical Provider, MD  isosorbide mononitrate (IMDUR) 30 MG 24 hr tablet Take 0.5 tablets (15 mg total) by  mouth daily. 03/29/13  Yes Ozella Rocks, MD  levothyroxine (SYNTHROID, LEVOTHROID) 137 MCG tablet TAKE  1 TABLET DAILY 04/09/13  Yes Ernestina Penna, MD  levothyroxine (SYNTHROID, LEVOTHROID) 25 MCG tablet TAKE 1/2 TABLET ON MONDAYS WEDNESDAYS & FRIDAYS ALONG WITH OTHER THYROID TABLET 01/03/13  Yes Ernestina Penna, MD  meclizine (ANTIVERT) 12.5 MG tablet Take 12.5 mg by mouth as needed.     Yes Historical Provider, MD  metoprolol (LOPRESSOR) 50 MG tablet Take 50 mg by mouth 2 (two) times daily.     Yes Historical Provider, MD  nitroGLYCERIN (NITROSTAT) 0.4 MG SL tablet Place 0.4 mg under the tongue every 5 (five) minutes as needed.     Yes Historical Provider, MD  promethazine (PHENERGAN) 25 MG tablet Take 1 tablet (25 mg total) by mouth every 6 (six) hours as needed. Take half tablet by mouth as needed 03/29/13  Yes Ozella Rocks, MD  ranitidine (ZANTAC 75) 75 MG tablet Take 1 tablet (75 mg total) by mouth 2 (two) times daily. 12/18/12  Yes Ernestina Penna, MD  traMADol (ULTRAM) 50 MG tablet Take 50 mg by mouth 4 (four) times daily as needed.     Yes Historical Provider, MD  warfarin (COUMADIN) 3 MG tablet Take 1 tablet by mouth as directed. 04/07/11  Yes Historical Provider, MD     ROS: As above in the HPI. All other systems are stable or negative.  OBJECTIVE: APPEARANCE:  Patient in no acute distress.The patient appeared well nourished and normally developed. Acyanotic. 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 Eyes:No scleral icterus. Fundi normal, difficulty raising eyebrows and closing lids.  Ears: Auricle normal, canal normal, Tympanic membranes normal, insufflation  normal. Nose: normal Throat: Blisters clustered on the left soft palate. Neck & thyroid: normal  CHEST & LUNGS: Chest wall: normal Lungs: Clear  CVS: Reveals the PMI to be normally located. Regular rhythm, First and Second Heart sounds are normal,  absence of murmurs, rubs or gallops. Peripheral vasculature: Radial pulses: normal Dorsal pedis pulses: normal Posterior pulses: normal  ABDOMEN:  Appearance: normal Benign, no organomegaly, no masses, no Abdominal Aortic enlargement. No Guarding , no rebound. No Bruits. Bowel sounds: normal  RECTAL: N/A GU: N/A  EXTREMETIES: nonedematous.  MUSCULOSKELETAL:  Spine:kyphosis  NEUROLOGIC: oriented to ,place and person; moves all 4 extremities equally Strength is 4.5/5 Sensory is abnormal left CN5  Cranial Nerves : CN5 is abnormal with a left Fifth/Facial palsy.  ASSESSMENT: Weakness of face muscles - Plan: EKG 12-Lead  Acute pharyngitis  PLAN:  Discussed with the ED physician. Need for further evaluation, ie head scan to r/o a CVA event, however this is an interesting presentation and may represent Zoster involving the CN5 with the blistering of the palate. And a Bell's palsy presentation. However, a hemorrhagic stroke is a possibility.  To [facility] ED via pvt vehicle.patient has been stable 3 days.   Mikailah Morel P. Modesto Charon, M.D.

## 2013-05-09 NOTE — ED Notes (Signed)
Spoke with Dr. Orvan Falconer and patient to be placed on telemetry.

## 2013-05-09 NOTE — ED Provider Notes (Addendum)
CSN: [id number]     Arrival date & time 05/09/13  1617 History  This chart was scribed for Donnetta Hutching, MD by Danella Maiers, ED Scribe. This patient was seen in room [facility] and the patient's care was started at 4:36 PM.    Chief Complaint  Patient presents with  . Cerebrovascular Accident   (Consider location/radiation/quality/duration/timing/severity/associated sxs/prior Treatment) HPI HPI Comments: [name] is a 79 y.o. female who presents to the Emergency Department complaining of a constant, worsening blister in the roof of her mouth with associated pain and swelling that started three days ago. She states it has spread to her left eye and left ear. She reports left ear pain and left eye tearing. She denies changes in vision. She denies facial rash. Severity is mild to moderate. No free chills, stiff neck, neuro deficits    Past Medical History  Diagnosis Date  . Atrial fibrillation     paroxysmal  . OA (osteoarthritis)   . Gastritis   . B12 deficiency   . Anemia   . PUD (peptic ulcer disease)   . ASCVD (arteriosclerotic cardiovascular disease)   . Spinal stenosis of lumbosacral region   . Hypothyroid   . Tachycardia-bradycardia     s/p PPM (Guidant)  . CVA (cerebrovascular accident)   . Ovarian mass     benign    Past Surgical History  Procedure Laterality Date  . Lumbar disc surgery  1991  . Gastric ulcer surgery    . Total abdominal hysterectomy w/ bilateral salpingoophorectomy  10/03    ovarian cysts   . 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 [employer]    Family History  Problem Relation Age of Onset  . Heart attack Mother   . Heart attack Father   . Cancer Sister   . Cancer Brother   . Cancer Brother    History  Substance Use Topics  . Smoking status: Former Smoker -- 0.50 packs/day for 4 years    Types: Cigarettes    Quit date:  09/06/1956  . Smokeless tobacco: Never Used  . Alcohol Use: No   OB History   Grav Para Term Preterm Abortions TAB SAB Ect Mult Living                 Review of Systems  HENT: Positive for ear pain and mouth sores.   Eyes: Positive for pain and discharge. Negative for visual disturbance.  Skin: Negative for rash.  A complete 10 system review of systems was obtained and all systems are negative except as noted in the HPI and PMH.    Allergies  Codeine  Home Medications   Current Outpatient Rx  Name  Route  Sig  Dispense  Refill  . acetaminophen (TYLENOL) 500 MG tablet   Oral   Take 500 mg by mouth every 6 (six) hours as needed.           Marland Kitchen amiodarone (PACERONE) 200 MG tablet   Oral   Take 1 tablet (200 mg total) by mouth daily.   30 tablet   9   . Cholecalciferol (VITAMIN D) 2000 UNITS tablet   Oral   Take 2,000 Units by mouth daily.           Marland Kitchen diltiazem (TIAZAC) 360 MG 24 hr capsule      TAKE (1) CAPSULE DAILY   30 capsule   2   .  furosemide (LASIX) 20 MG tablet      TAKE 1 TABLET IN THE MORNING AND AT 4PM   60 tablet   4   . furosemide (LASIX) 40 MG tablet      One po bid x 3 days for swelling along with current lasix   6 tablet   0   . HYDROcodone-acetaminophen (NORCO) 5-325 MG per tablet      TAKE 1/2 TABLET TWICE DAILY AS NEEDED   30 tablet   0   . IRON PO   Oral   Take 1 tablet by mouth daily.          . isosorbide mononitrate (IMDUR) 30 MG 24 hr tablet   Oral   Take 0.5 tablets (15 mg total) by mouth daily.   30 tablet   5   . levothyroxine (SYNTHROID, LEVOTHROID) 137 MCG tablet      TAKE 1 TABLET DAILY   30 tablet   2   . levothyroxine (SYNTHROID, LEVOTHROID) 25 MCG tablet      TAKE 1/2 TABLET ON MONDAYS WEDNESDAYS & FRIDAYS ALONG WITH OTHER THYROID TABLET   10 tablet   8   . meclizine (ANTIVERT) 12.5 MG tablet   Oral   Take 12.5 mg by mouth as needed.           . metoprolol (LOPRESSOR) 50 MG tablet   Oral   Take  50 mg by mouth 2 (two) times daily.           . nitroGLYCERIN (NITROSTAT) 0.4 MG SL tablet   Sublingual   Place 0.4 mg under the tongue every 5 (five) minutes as needed.           . promethazine (PHENERGAN) 25 MG tablet   Oral   Take 1 tablet (25 mg total) by mouth every 6 (six) hours as needed. Take half tablet by mouth as needed   30 tablet   3   . ranitidine (ZANTAC 75) 75 MG tablet   Oral   Take 1 tablet (75 mg total) by mouth 2 (two) times daily.         . traMADol (ULTRAM) 50 MG tablet   Oral   Take 50 mg by mouth 4 (four) times daily as needed.           . warfarin (COUMADIN) 3 MG tablet   Oral   Take 1 tablet by mouth as directed.          BP 159/72  Pulse 59  Temp(Src) 97.9 F (36.6 C) (Oral)  Resp 20  Ht 5\' 2"  (1.575 m)  Wt 120 lb (54.432 kg)  BMI 21.94 kg/m2  SpO2 95% Physical Exam  Nursing note and vitals reviewed. Constitutional: She is oriented to person, place, and time. She appears well-developed and well-nourished.  HENT:  Head: Normocephalic and atraumatic.  errythematous ulcerated look on left upper palette  Left facial droop  Eyes: Conjunctivae and EOM are normal. Pupils are equal, round, and reactive to light.  Clear drainage from left eye  Neck: Normal range of motion. Neck supple.  Cardiovascular: Normal rate, regular rhythm and normal heart sounds.   Pulmonary/Chest: Effort normal and breath sounds normal.  Abdominal: Soft. Bowel sounds are normal.  Musculoskeletal: Normal range of motion.  Neurological: She is alert and oriented to person, place, and time.  Skin: Skin is warm and dry.  Psychiatric: She has a normal mood and affect.    ED Course  Procedures (including critical  care time) Medications  acyclovir (ZOVIRAX) 250 mg in dextrose 5 % 100 mL IVPB (250 mg Intravenous New Bag/Given 05/09/13 1801)    DIAGNOSTIC STUDIES: Oxygen Saturation is 95% on room air, adequate by my interpretation.    COORDINATION OF CARE: 4:45  PM- Discussed treatment plan with pt which includes blood work, head x-ray, and antiviral medication and pt agrees to plan.    Labs Review Labs Reviewed  BASIC METABOLIC PANEL - Abnormal; Notable for the following:    GFR calc non Af Amer 42 (*)    GFR calc Af Amer 49 (*)    All other components within normal limits  CBC WITH DIFFERENTIAL - Abnormal; Notable for the following:    Hemoglobin 11.7 (*)    Monocytes Relative 13 (*)    All other components within normal limits  PROTIME-INR - Abnormal; Notable for the following:    Prothrombin Time 23.6 (*)    INR 2.18 (*)    All other components within normal limits  TROPONIN I     Date: 05/09/2013  Rate: 60  Rhythm: paced rhythm  QRS Axis: normal  Intervals: normal  ST/T Wave abnormalities: normal  Conduction Disutrbances: none  Narrative Interpretation: unremarkable    Imaging Review No results found.  MDM  No diagnosis found. Hx and PE c/w c HSV l.   Rx IV acyclovir.  No clinical evidence of stroke. Admit to general medicine    I personally performed the services described in this documentation, which was scribed in my presence. The recorded information has been reviewed and is accurate.    Donnetta Hutching, MD 05/09/13 1918  Donnetta Hutching, MD 05/12/13 [phone]

## 2013-05-09 NOTE — H&P (Signed)
[facility] History and Physical  [name]  UJW:[id number]  DOB: Aug 02, 1918   DOA: 05/09/2013   PCP:   [name], MD   Chief Complaint:  Difficulty swallowing for 3 days   HPI: [name] is a 79 y.o. female.   Elderly Caucasian lady who lives alone, has a home health coming in 5 days per week, has noted sores in her mouth, difficulty, weakness of the left side of face her face and "puffiness" of the left eye were since Sunday. She reports she can eat if the food is very soft. Denies fever or chills Denies burning or itching of the eyes  At baseline she uses a rolling walker    Rewiew of Systems:   All systems negative except as marked bold or noted in the HPI;  Constitutional:    malaise, fever and chills. ;  Eyes:   eye pain, redness and discharge. ;  ENMT:   ear pain, hoarseness, nasal congestion, sinus pressure and sore throat. ;  Cardiovascular:    chest pain, palpitations, diaphoresis, dyspnea and peripheral edema.  Respiratory:   cough, hemoptysis, wheezing and stridor. ;  Gastrointestinal:  nausea, vomiting, diarrhea, constipation, abdominal pain, melena, blood in stool, hematemesis, jaundice and rectal bleeding. unusual weight loss..   Genitourinary:    frequency, dysuria, incontinence,flank pain and hematuria; Musculoskeletal:   back pain and neck pain.  swelling and trauma.;  Skin: .  pruritus, rash, abrasions, bruising and skin lesion.; ulcerations Neuro:    headache, lightheadedness and neck stiffness.  weakness, altered level of consciousness, altered mental status, extremity weakness, burning feet, involuntary movement, seizure and syncope.  Psych:    anxiety, depression, insomnia, tearfulness, panic attacks, hallucinations, paranoia, suicidal or homicidal ideation    Past Medical History  Diagnosis Date  . Atrial fibrillation     paroxysmal  . OA (osteoarthritis)   . Gastritis   . B12 deficiency   . Anemia   . PUD (peptic ulcer disease)    . ASCVD (arteriosclerotic cardiovascular disease)   . Spinal stenosis of lumbosacral region   . Hypothyroid   . Tachycardia-bradycardia     s/p PPM (Guidant)  . CVA (cerebrovascular accident)   . Ovarian mass     benign     Past Surgical History  Procedure Laterality Date  . Lumbar disc surgery  1991  . Gastric ulcer surgery    . Total abdominal hysterectomy w/ bilateral salpingoophorectomy  10/03    ovarian cysts   . 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 [employer]     Medications:  HOME MEDS: Prior to Admission medications   Medication Sig Start Date End Date Taking? Authorizing Provider  acetaminophen (TYLENOL) 500 MG tablet Take 500 mg by mouth every 6 (six) hours as needed for pain.    Yes Historical Provider, MD  amiodarone (PACERONE) 200 MG tablet Take 1 tablet (200 mg total) by mouth daily. 12/01/12  Yes Hillis Range, MD  Cholecalciferol (VITAMIN D) 2000 UNITS tablet Take 2,000 Units by mouth daily.     Yes Historical Provider, MD  diltiazem (TIAZAC) 360 MG 24 hr capsule Take 360 mg by mouth daily.   Yes Historical Provider, MD  furosemide (LASIX) 20 MG tablet Take 20 mg by mouth 2 (two) times daily. Morning and 4pm   Yes Historical Provider, MD  HYDROcodone-acetaminophen (NORCO) 5-325 MG per tablet TAKE 1/2 TABLET TWICE  DAILY AS NEEDED 12/20/12  Yes Ernestina Penna, MD  IRON PO Take 1 tablet by mouth daily.    Yes Historical Provider, MD  isosorbide mononitrate (IMDUR) 30 MG 24 hr tablet Take 15 mg by mouth daily.   Yes Historical Provider, MD  levothyroxine (SYNTHROID, LEVOTHROID) 137 MCG tablet Take 137 mcg by mouth daily.   Yes Historical Provider, MD  levothyroxine (SYNTHROID, LEVOTHROID) 25 MCG tablet Take 12.5 mcg by mouth daily. Patient takes on Monday,Wednesday,and Friday along with other thyroid tablet.   Yes Historical Provider, MD  meclizine  (ANTIVERT) 12.5 MG tablet Take 12.5 mg by mouth as needed for dizziness.    Yes Historical Provider, MD  metoprolol (LOPRESSOR) 50 MG tablet Take 50 mg by mouth 2 (two) times daily.     Yes Historical Provider, MD  promethazine (PHENERGAN) 25 MG tablet Take 1 tablet (25 mg total) by mouth every 6 (six) hours as needed. Take half tablet by mouth as needed 03/29/13  Yes Ozella Rocks, MD  ranitidine (ZANTAC 75) 75 MG tablet Take 1 tablet (75 mg total) by mouth 2 (two) times daily. 12/18/12  Yes Ernestina Penna, MD  warfarin (COUMADIN) 3 MG tablet Take 1 tablet by mouth daily.  04/07/11  Yes Historical Provider, MD  nitroGLYCERIN (NITROSTAT) 0.4 MG SL tablet Place 0.4 mg under the tongue every 5 (five) minutes as needed for chest pain.     Historical Provider, MD  traMADol (ULTRAM) 50 MG tablet Take 50 mg by mouth 4 (four) times daily as needed for pain.     Historical Provider, MD     Allergies:  Allergies  Allergen Reactions  . Codeine Nausea Only    GI UPSET    Social History:   reports that she quit smoking about 56 years ago. Her smoking use included Cigarettes. She has a 2 pack-year smoking history. She has never used smokeless tobacco. She reports that she does not drink alcohol or use illicit drugs.  Family History: Family History  Problem Relation Age of Onset  . Heart attack Mother   . Heart attack Father   . Cancer Sister   . Cancer Brother   . Cancer Brother      Physical Exam: Filed Vitals:   05/09/13 1845 05/09/13 1900 05/09/13 2000 05/09/13 2125  BP: 159/62 140/78 152/83 164/70  Pulse: 60 59 59 61  Temp:    98.2 F (36.8 C)  TempSrc:    Oral  Resp: 16  18 18   Height:    5\' 6"  (1.676 m)  Weight:    56.8 kg (125 lb 3.5 oz)  SpO2: 95% 96% 95% 94%   Blood pressure 164/70, pulse 61, temperature 98.2 F (36.8 C), temperature source Oral, resp. rate 18, height 5\' 6"  (1.676 m), weight 56.8 kg (125 lb 3.5 oz), SpO2 94.00%. Body mass index is 20.22 kg/(m^2).   GEN:   Pleasant  elderly Caucasian lady  lying bed complaining of sore throat ; cooperative with exam PSYCH:  alert and oriented ;  affect is appropriate. HEENT: Mucous membranes pink and anicteric; PERRLA; EOM intact; no cervical lymphadenopathy nor thyromegaly or carotid bruit; no JVD; a small reddish ulcers on the left hard palate, and left oropharynx Breasts:: Not examined CHEST WALL: No tenderness CHEST: Normal respiration, clear to auscultation bilaterally HEART: Regular rate and rhythm;  3/6 systolic murmur  BACK:  Moderate  kyphosis no CVA tenderness ABDOMEN: , soft non-tender; no masses, no organomegaly, normal abdominal bowel sounds;  no pannus; no intertriginous candida. Rectal Exam: Not done EXTREMITIES: age-appropriate arthropathy of the hands and knees; trace edema; superficial ulceration right leg Genitalia: not examined PULSES: 2+ and symmetric CNS:  Lower motor neuron seventh palsy;  no  Other focal lateralizing neurologic deficit   Labs on Admission:  Basic Metabolic Panel:  Recent Labs Lab 05/09/13 1656  NA 138  K 3.9  CL 99  CO2 31  GLUCOSE 98  BUN 23  CREATININE 1.09  CALCIUM 9.0   Liver Function Tests: No results found for this basename: AST, ALT, ALKPHOS, BILITOT, PROT, ALBUMIN,  in the last 168 hours No results found for this basename: LIPASE, AMYLASE,  in the last 168 hours No results found for this basename: AMMONIA,  in the last 168 hours CBC:  Recent Labs Lab 05/09/13 1656  WBC 5.8  NEUTROABS 3.8  HGB 11.7*  HCT 36.3  MCV 88.3  PLT 158   Cardiac Enzymes:  Recent Labs Lab 05/09/13 1942  TROPONINI <0.30   BNP: No components found with this basename: POCBNP,  D-dimer: No components found with this basename: D-DIMER,  CBG: No results found for this basename: GLUCAP,  in the last 168 hours  Radiological Exams on Admission: Ct Head Wo Contrast  05/09/2013   *RADIOLOGY REPORT*  Clinical Data: Left facial droop.  History of stroke.  CT HEAD  WITHOUT CONTRAST  Technique:  Contiguous axial images were obtained from the base of the skull through the vertex without contrast.  Comparison: CT scan dated 04/10/2011  Findings: There is no acute intracranial hemorrhage, infarction, or mass lesion.  There is an old left occipital infarct with secondary encephalomalacia.  There is diffuse cerebral cortical atrophy with secondary ventricular dilatation, stable.  No acute osseous abnormality.  There are a few tiny white matter lucencies in the superior aspects of the basal ganglia bilaterally which are stable and consistent with chronic small vessel ischemic disease.  IMPRESSION: No acute intracranial abnormality.   Original Report Authenticated By: Francene Boyers, M.D.     Assessment/Plan   Active Problems:   HYPOTHYROIDISM   PAROXYSMAL ATRIAL FIBRILLATION   A-fib   Tachycardia-bradycardia   Bell's palsy   Herpes simplex complication    PLAN:  Admit this lady for intravenous acyclovir Give Magic mouthwash with lidocaine;  a pured diet advance as tolerated Artificial tears; consider eye patch to the left high to protect the cornea.   Other plans as per orders.  Code Status:  Full code to be reconfirmed Family Communication: Plans discuss with patient, no family at bedside Disposition Plan: in a few days    Skyley Grandmaison Nocturnist [facility] Pager [phone]   05/09/2013, 9:48 PM

## 2013-05-09 NOTE — ED Notes (Signed)
Phoned AC for medication.

## 2013-05-09 NOTE — ED Notes (Signed)
Patient c/o chest pain; patient rubs all of her chest when asked where pain is.

## 2013-05-09 NOTE — Progress Notes (Signed)
ANTICOAGULATION CONSULT NOTE - Initial Consult  Pharmacy Consult for Coumadin Indication: atrial fibrillation  Allergies  Allergen Reactions  . Codeine Nausea Only    GI UPSET    Patient Measurements: Height: 5\' 6"  (167.6 cm) Weight: 125 lb 3.5 oz (56.8 kg) IBW/kg (Calculated) : 59.3  Vital Signs: Temp: 98.2 F (36.8 C) (09/03 2125) Temp src: Oral (09/03 2125) BP: 164/70 mmHg (09/03 2125) Pulse Rate: 61 (09/03 2125)  Labs:  Recent Labs  05/09/13 1656 05/09/13 1942  HGB 11.7*  --   HCT 36.3  --   PLT 158  --   LABPROT 23.6*  --   INR 2.18*  --   CREATININE 1.09  --   TROPONINI  --  <0.30    Estimated Creatinine Clearance: 28.9 ml/min (by C-G formula based on Cr of 1.09).   Medical History: Past Medical History  Diagnosis Date  . Atrial fibrillation     paroxysmal  . OA (osteoarthritis)   . Gastritis   . B12 deficiency   . Anemia   . PUD (peptic ulcer disease)   . ASCVD (arteriosclerotic cardiovascular disease)   . Spinal stenosis of lumbosacral region   . Hypothyroid   . Tachycardia-bradycardia     s/p PPM (Guidant)  . CVA (cerebrovascular accident)   . Ovarian mass     benign     Medications:  Scheduled:  . [START ON 05/10/2013] amiodarone  200 mg Oral Daily  . [START ON 05/10/2013] diltiazem  360 mg Oral Daily  . [START ON 05/10/2013] furosemide  20 mg Oral BID  . [START ON 05/10/2013] isosorbide mononitrate  15 mg Oral Daily  . [START ON 05/11/2013] levothyroxine  12.5 mcg Oral 3 times weekly  . [START ON 05/10/2013] levothyroxine  137 mcg Oral QAC breakfast  . metoprolol  50 mg Oral BID  . sodium chloride  3 mL Intravenous Q12H    Assessment: 79 yo F on chronic warfarin 3mg  daily for Afib.  INR therapeutic on admission.  No bleeding noted.   Goal of Therapy:  INR 2-3   Plan:  1.  Coumadin 3mg  po daily 2.  INR daily for now  Elson Clan 05/09/2013,10:40 PM

## 2013-05-09 NOTE — ED Notes (Signed)
Pt reports that on Sunday 8/31 she noted a blister in the roof of her mouth,  Last night she noticed that her left lip was drooping. And left eye is alos drooping.

## 2013-05-09 NOTE — Progress Notes (Signed)
ANTIBIOTIC CONSULT NOTE - INITIAL  Pharmacy Consult for Acyclovir Indication: Mucocutaneous HSV  Allergies  Allergen Reactions  . Codeine Nausea Only    GI UPSET    Patient Measurements: Height: 5\' 6"  (167.6 cm) Weight: 125 lb 3.5 oz (56.8 kg) IBW/kg (Calculated) : 59.3  Vital Signs: Temp: 98.2 F (36.8 C) (09/03 2125) Temp src: Oral (09/03 2125) BP: 164/70 mmHg (09/03 2125) Pulse Rate: 61 (09/03 2125) Intake/Output from previous day:   Intake/Output from this shift: Total I/O In: -  Out: 300 [Urine:300]  Labs:  Recent Labs  05/09/13 1656  WBC 5.8  HGB 11.7*  PLT 158  CREATININE 1.09   Estimated Creatinine Clearance: 28.9 ml/min (by C-G formula based on Cr of 1.09). No results found for this basename: VANCOTROUGH, VANCOPEAK, VANCORANDOM, GENTTROUGH, GENTPEAK, GENTRANDOM, TOBRATROUGH, TOBRAPEAK, TOBRARND, AMIKACINPEAK, AMIKACINTROU, AMIKACIN,  in the last 72 hours   Microbiology: No results found for this or any previous visit (from the past 720 hour(s)).  Medical History: Past Medical History  Diagnosis Date  . Atrial fibrillation     paroxysmal  . OA (osteoarthritis)   . Gastritis   . B12 deficiency   . Anemia   . PUD (peptic ulcer disease)   . ASCVD (arteriosclerotic cardiovascular disease)   . Spinal stenosis of lumbosacral region   . Hypothyroid   . Tachycardia-bradycardia     s/p PPM (Guidant)  . CVA (cerebrovascular accident)   . Ovarian mass     benign     Medications:  Scheduled:  . [START ON 05/10/2013] amiodarone  200 mg Oral Daily  . [START ON 05/10/2013] diltiazem  360 mg Oral Daily  . [START ON 05/10/2013] furosemide  20 mg Oral BID  . [START ON 05/10/2013] isosorbide mononitrate  15 mg Oral Daily  . [START ON 05/11/2013] levothyroxine  12.5 mcg Oral 3 times weekly  . [START ON 05/10/2013] levothyroxine  137 mcg Oral QAC breakfast  . metoprolol  50 mg Oral BID  . sodium chloride  3 mL Intravenous Q12H  . warfarin  3 mg Oral NOW  . [START ON  05/10/2013] Warfarin - Pharmacist Dosing Inpatient   Does not apply Q24H   Assessment: 79 yo F with blisters on the roof of her mouth that has spread to left eye & ear.  This has worsened over past few days.   Renal function is at patient's baseline, however given her advanced age is only estimated at 25-30 ml/min. She received Acyclovir in ED x1.  Acyclovir 9/3>>  Goal of Therapy:  Eradicate infection.  Plan:  1.  Acyclovir 250mg  IV q12h 2.  Monitor renal function and cx data   Elson Clan 05/09/2013,10:51 PM

## 2013-05-10 DIAGNOSIS — B0089 Other herpesviral infection: Secondary | ICD-10-CM | POA: Diagnosis present

## 2013-05-10 DIAGNOSIS — R29810 Facial weakness: Secondary | ICD-10-CM

## 2013-05-10 DIAGNOSIS — G510 Bell's palsy: Secondary | ICD-10-CM | POA: Diagnosis present

## 2013-05-10 LAB — COMPREHENSIVE METABOLIC PANEL
AST: 34 U/L (ref 0–37)
Albumin: 3 g/dL — ABNORMAL LOW (ref 3.5–5.2)
Alkaline Phosphatase: 74 U/L (ref 39–117)
BUN: 19 mg/dL (ref 6–23)
Chloride: 102 mEq/L (ref 96–112)
Potassium: 3.9 mEq/L (ref 3.5–5.1)
Total Bilirubin: 0.6 mg/dL (ref 0.3–1.2)

## 2013-05-10 LAB — URINALYSIS, ROUTINE W REFLEX MICROSCOPIC
Bilirubin Urine: NEGATIVE
Glucose, UA: NEGATIVE mg/dL
Hgb urine dipstick: NEGATIVE
Nitrite: NEGATIVE
Specific Gravity, Urine: 1.015 (ref 1.005–1.030)
pH: 7 (ref 5.0–8.0)

## 2013-05-10 LAB — CBC
Hemoglobin: 12.2 g/dL (ref 12.0–15.0)
MCH: 28.1 pg (ref 26.0–34.0)
MCV: 88.2 fL (ref 78.0–100.0)
RBC: 4.34 MIL/uL (ref 3.87–5.11)

## 2013-05-10 LAB — PROTIME-INR: Prothrombin Time: 21.5 seconds — ABNORMAL HIGH (ref 11.6–15.2)

## 2013-05-10 LAB — HEMOGLOBIN A1C: Hgb A1c MFr Bld: 6.2 % — ABNORMAL HIGH (ref <5.7)

## 2013-05-10 MED ORDER — ARTIFICIAL TEARS OP OINT
TOPICAL_OINTMENT | Freq: Every day | OPHTHALMIC | Status: DC
  Administered 2013-05-10: 22:00:00 via OPHTHALMIC
  Filled 2013-05-10: qty 3.5

## 2013-05-10 MED ORDER — DILTIAZEM HCL ER COATED BEADS 180 MG PO CP24
360.0000 mg | ORAL_CAPSULE | Freq: Every day | ORAL | Status: DC
  Administered 2013-05-10 – 2013-05-11 (×2): 360 mg via ORAL
  Filled 2013-05-10 (×2): qty 2

## 2013-05-10 MED ORDER — PANTOPRAZOLE SODIUM 40 MG PO TBEC
40.0000 mg | DELAYED_RELEASE_TABLET | Freq: Every day | ORAL | Status: DC
  Administered 2013-05-10 – 2013-05-11 (×2): 40 mg via ORAL
  Filled 2013-05-10 (×2): qty 1

## 2013-05-10 MED ORDER — MAGIC MOUTHWASH: 5.0000 mL | Freq: Four times a day (QID) | ORAL | Status: DC | PRN

## 2013-05-10 MED ORDER — VALACYCLOVIR HCL 500 MG PO TABS: 1000.0000 mg | ORAL_TABLET | Freq: Three times a day (TID) | ORAL | Status: DC

## 2013-05-10 MED ORDER — PREDNISONE 20 MG PO TABS
60.0000 mg | ORAL_TABLET | Freq: Every day | ORAL | Status: DC
  Administered 2013-05-10 – 2013-05-11 (×2): 60 mg via ORAL
  Filled 2013-05-10 (×2): qty 3

## 2013-05-10 MED ORDER — VALACYCLOVIR HCL 500 MG PO TABS
1000.0000 mg | ORAL_TABLET | Freq: Two times a day (BID) | ORAL | Status: DC
  Administered 2013-05-10 – 2013-05-11 (×2): 1000 mg via ORAL
  Filled 2013-05-10 (×8): qty 2

## 2013-05-10 MED ORDER — LIDOCAINE VISCOUS 2 % MT SOLN: 5.0000 mL | Freq: Four times a day (QID) | OROMUCOSAL | Status: DC | PRN

## 2013-05-10 MED ORDER — POLYVINYL ALCOHOL 1.4 % OP SOLN: 1.0000 [drp] | OPHTHALMIC | Status: DC | PRN

## 2013-05-10 MED ORDER — WARFARIN SODIUM 2 MG PO TABS
3.0000 mg | ORAL_TABLET | Freq: Once | ORAL | Status: AC
  Administered 2013-05-10: 16:00:00 3 mg via ORAL
  Filled 2013-05-10: qty 1

## 2013-05-10 NOTE — Evaluation (Signed)
Physical Therapy Evaluation Patient Details Name: [name] MRN: [id number] DOB: November 10, 1917 Today's Date: 05/10/2013 Time:  -     PT Assessment / Plan / Recommendation History of Present Illness  Pt is admitted with Bell's Palsey.  She reports that she began to develop mouth sores about 4 days ago and has since had difficulty eating.  Her aide noticed yesterday that her face was "twisted", whereupon she came to the hospital.  She lives alone with children nearby and has an aide 5 hours/day M-F.  Clinical Impression   At the time of my arrival, pt was c/o nausea.  Nursing was called and pt was medicated,  She was hungry, but felt as if she couldn't tolerate food, so she was provided with ginger ale and unsalted crackers which helped.  Pt has stated that she has had some falls at home....doesn't know why.  I was unable to convince her to try to work with me to evaluate her gait.  She states that she just doesn't feel up to it right now.  I will return a bit later and try again to complete this evaluation.    PT Assessment       Follow Up Recommendations       Does the patient have the potential to tolerate intense rehabilitation      Barriers to Discharge        Equipment Recommendations       Recommendations for Other Services     Frequency      Precautions / Restrictions Precautions Precautions: Fall Restrictions Weight Bearing Restrictions: No   Pertinent Vitals/Pain       Mobility       Exercises     PT Diagnosis:    PT Problem List:   PT Treatment Interventions:       PT Goals(Current goals can be found in the care plan section)    Visit Information  Last PT Received On: 05/10/13 History of Present Illness: Pt is admitted with Bell's Palsey.  She reports that she began to develop mouth sores about 4 days ago and has since had difficulty eating.  Her aide noticed yesterday that her face was "twisted", whereupon she came to the hospital.  She lives alone  with children nearby and has an aide 5 hours/day M-F.       Prior Functioning  Home Living Family/patient expects to be discharged to:: Private residence Living Arrangements: Alone Available Help at Discharge: Family;Personal care attendant;Available PRN/intermittently Type of Home: House Home Access: Level entry Home Layout: One level Home Equipment: Walker - 2 wheels Prior Function Level of Independence: Needs assistance Gait / Transfers Assistance Needed: independent with gait and transfers ADL's / Homemaking Assistance Needed: needs assistance with household activities but is able to do personal ADLs independently Communication / Swallowing Assistance Needed: very weak left facial strength which presents some difficulty in speaking, chewing, etc. Communication Communication: HOH    Cognition  Cognition Arousal/Alertness: Awake/alert Behavior During Therapy: WFL for tasks assessed/performed Overall Cognitive Status: Within Functional Limits for tasks assessed    Extremity/Trunk Assessment     Balance    End of Session    GP     Konrad Penta 05/10/2013, 9:26 AM

## 2013-05-10 NOTE — Progress Notes (Signed)
ANTICOAGULATION CONSULT NOTE  Pharmacy Consult for Coumadin Indication: atrial fibrillation  Allergies  Allergen Reactions  . Codeine Nausea Only    GI UPSET    Patient Measurements: Height: 5\' 6"  (167.6 cm) Weight: 123 lb 14.4 oz (56.2 kg) IBW/kg (Calculated) : 59.3  Vital Signs: Temp: 97.3 F (36.3 C) (09/04 0639) Temp src: Oral (09/04 0256) BP: 160/71 mmHg (09/04 0639) Pulse Rate: 65 (09/04 0639)  Labs:  Recent Labs  05/09/13 1656 05/09/13 1942 05/10/13 0611  HGB 11.7*  --  12.2  HCT 36.3  --  38.3  PLT 158  --  138*  LABPROT 23.6*  --  21.5*  INR 2.18*  --  1.93*  CREATININE 1.09  --  0.94  TROPONINI  --  <0.30  --     Estimated Creatinine Clearance: 33.2 ml/min (by C-G formula based on Cr of 0.94).   Medical History: Past Medical History  Diagnosis Date  . Atrial fibrillation     paroxysmal  . OA (osteoarthritis)   . Gastritis   . B12 deficiency   . Anemia   . PUD (peptic ulcer disease)   . ASCVD (arteriosclerotic cardiovascular disease)   . Spinal stenosis of lumbosacral region   . Hypothyroid   . Tachycardia-bradycardia     s/p PPM (Guidant)  . CVA (cerebrovascular accident)   . Ovarian mass     benign     Medications:  Scheduled:  . acyclovir  250 mg Intravenous Q12H  . amiodarone  200 mg Oral Daily  . diltiazem  360 mg Oral Daily  . furosemide  20 mg Oral BID  . isosorbide mononitrate  15 mg Oral Daily  . [START ON 05/11/2013] levothyroxine  12.5 mcg Oral 3 times weekly  . levothyroxine  137 mcg Oral QAC breakfast  . metoprolol  50 mg Oral BID  . sodium chloride  3 mL Intravenous Q12H  . Warfarin - Pharmacist Dosing Inpatient   Does not apply Q24H    Assessment: 79 yo F on chronic warfarin 3mg  daily for Afib.  INR therapeutic on admission, but has trended below goal today.  No bleeding noted.   Goal of Therapy:  INR 2-3   Plan:  1.  Coumadin 3mg  po x1 today- if INR continues to decrease will increase dose tomorrow.  2.  INR  daily for now  Elson Clan 05/10/2013,8:16 AM

## 2013-05-10 NOTE — Progress Notes (Signed)
Pt. Stated she was having sharp chest pain. RT got EKG which showed no changes from EKG yesterday. Pt. Medicated for pain. She states she is no longer having chest pain. MD notified.

## 2013-05-10 NOTE — Evaluation (Signed)
Physical Therapy Evaluation Patient Details Name: [name] MRN: [id number] DOB: [birth year] Today's Date: 05/10/2013 Time: 4098-1191 PT Time Calculation (min): 23 min  PT Assessment / Plan / Recommendation History of Present Illness  Pt is admitted with Bell's Palsey.  She reports that she began to develop mouth sores about 4 days ago and has since had difficulty eating.  Her aide noticed yesterday that her face was "twisted", whereupon she came to the hospital.  She lives alone with children nearby and has an aide 5 hours/day M-F.  Clinical Impression   Pt has been seen twice this morning for evaluation and increased mobilization.  She has c/o nausea each time and has been unwilling to try to mobilize OOB.  On 2nd try this AM she was willing to let me check general strength which appears to be WNL.  We will have to defer transfer and gait eval until tomorrow.  Given her dx, I am anticipating no  PT needs, however the longer it takes [place] to get her up OOB, the less likely this will become.    PT Assessment       Follow Up Recommendations   (unable to determine at this time)    Does the patient have the potential to tolerate intense rehabilitation    no  Barriers to Discharge        Equipment Recommendations  None recommended by PT    Recommendations for Other Services     Frequency      Precautions / Restrictions Precautions Precautions: Fall Restrictions Weight Bearing Restrictions: No   Pertinent Vitals/Pain       Mobility  Bed Mobility Bed Mobility: Not assessed Details for Bed Mobility Assistance: Pt continues to be too nauseated to try to mobilize OOB    Exercises     PT Diagnosis:    PT Problem List:   PT Treatment Interventions:       PT Goals(Current goals can be found in the care plan section) Acute Rehab PT Goals Patient Stated Goal: none stated PT Goal Formulation: Patient unable to participate in goal setting Time For Goal Achievement:  05/17/13 Potential to Achieve Goals: Good  Visit Information  Last PT Received On: 05/10/13 History of Present Illness: Pt is admitted with Bell's Palsey.  She reports that she began to develop mouth sores about 4 days ago and has since had difficulty eating.  Her aide noticed yesterday that her face was "twisted", whereupon she came to the hospital.  She lives alone with children nearby and has an aide 5 hours/day M-F.       Prior Functioning  Home Living Family/patient expects to be discharged to:: Private residence Living Arrangements: Alone Available Help at Discharge: Family;Personal care attendant;Available PRN/intermittently Type of Home: House Home Access: Level entry Home Layout: One level Home Equipment: Walker - 2 wheels Prior Function Level of Independence: Needs assistance Gait / Transfers Assistance Needed: independent with gait and transfers ADL's / Homemaking Assistance Needed: needs assistance with household activities but is able to do personal ADLs independently Communication / Swallowing Assistance Needed: very weak left facial strength which presents some difficulty in speaking, chewing, etc. Communication Communication: HOH    Cognition  Cognition Arousal/Alertness: Awake/alert Behavior During Therapy: WFL for tasks assessed/performed Overall Cognitive Status: Within Functional Limits for tasks assessed    Extremity/Trunk Assessment Lower Extremity Assessment Lower Extremity Assessment: Overall WFL for tasks assessed Cervical / Trunk Assessment Cervical / Trunk Assessment: Kyphotic   Balance  End of Session PT - End of Session Activity Tolerance: Treatment limited secondary to medical complications (Comment) (severe nausea) Patient left: in bed;with call bell/phone within reach;with family/visitor present  GP     Myrlene Broker L 05/10/2013, 10:57 AM

## 2013-05-10 NOTE — Progress Notes (Signed)
[facility] PROGRESS NOTE  [name]:[id number] DOB: Feb 04, 1918 DOA: 05/09/2013 PCP: [name], MD  Assessment/Plan: 1. Mouth ulcers, odynophagia: Etiology unclear, shingles or HSV considered. This appears to be limited to one "dermatome". There is no involvement of the face or the left eye. 2. Complete Left facial weakness: Consistent with Bell's palsy, no other focal monitor deficits to suggest central process. CT of the head was negative, patient has pacemaker. I do not think further imaging would be fruitful or as indicated. Possibly related to either HSV or herpes zoster. Treatment is supportive and pharmacologic as above. 3. Generalized weakness: Physical therapy consultation. 4. Chest pain: Resolved. She has intermittent chest pain as an outpatient, no change. History suggests chronic stable angina. No further evaluation suggested. 5. Atrial fibrillation: Warfarin per pharmacy. 6. History of cardiovascular disease 7. History of stroke   Start valacyclovir 1000 mg 3 times a day  Prednisone 60 mg by mouth daily  Anticipate discharge 9/5 pending physical therapy evaluation  Pending studies:   TSH  Hemoglobin A1c  Code Status: Full code DVT prophylaxis: SCDs Family Communication: None present Disposition Plan: Home 9/5  Brendia Sacks, MD  [facility]  Pager [phone] If 7PM-7AM, please contact night-coverage at [email or web address], password [facility] 05/10/2013, 12:19 PM  LOS: 1 day   Summary: 79 year old woman presented with sores in her mouth, left-sided facial weakness for approximately 3 days.  Consultants:  Physical therapy  Procedures:    Antibiotics:  Acyclovir 9/3 >>   HPI/Subjective: Some nausea. No vomiting. Eating okay. Main complaint is soreness in her mouth. No rash on face or visual problems or pain in eye.  Objective: Filed Vitals:   05/09/13 2125 05/10/13 0018 05/10/13 0256 05/10/13 0639  BP: 164/70 144/64 152/53 160/71  Pulse:  61  60 65  Temp: 98.2 F (36.8 C)  97.4 F (36.3 C) 97.3 F (36.3 C)  TempSrc: Oral  Oral   Resp: 18  18 22   Height: 5\' 6"  (1.676 m)     Weight: 56.8 kg (125 lb 3.5 oz)   56.2 kg (123 lb 14.4 oz)  SpO2: 94%  93% 91%    Intake/Output Summary (Last 24 hours) at 05/10/13 1219 Last data filed at 05/10/13 0800  Gross per 24 hour  Intake    120 ml  Output    750 ml  Net   -630 ml     Filed Weights   05/09/13 1625 05/09/13 2125 05/10/13 0639  Weight: 54.432 kg (120 lb) 56.8 kg (125 lb 3.5 oz) 56.2 kg (123 lb 14.4 oz)    Exam:   Afebrile, vital signs stable  General: Appears calm and comfortable.  Psychiatric: Grossly normal mood and affect. Speech fluent and appropriate.  Cardiovascular: Regular rate and rhythm. No murmur, rub, gallop. No lower extremity edema.  Respiratory: Clear to auscultation bilaterally. No wheezes, rales, rhonchi. Normal respiratory effort.  Abdomen: Soft, nontender, nondistended.  ENT: lips and tongue appear unremarkable. There is a white irregular patchy ulcer in the midline on the hard palate with a few other lesions as well on the hard palate.  Head: There is no rash on the face. She has obvious left-sided facial weakness from forehead to lips. She is not able to completely close her left eye.  Musculoskeletal: Grossly normal tone and strength all extremities.  Data Reviewed:  Complete metabolic panel unremarkable  Troponin negative   CBC unremarkable  INR 1.93. CT head negative  EKG: Paced rhythm.   Scheduled Meds: .  acyclovir  250 mg Intravenous Q12H  . amiodarone  200 mg Oral Daily  . diltiazem  360 mg Oral Daily  . furosemide  20 mg Oral BID  . isosorbide mononitrate  15 mg Oral Daily  . [START ON 05/11/2013] levothyroxine  12.5 mcg Oral 3 times weekly  . levothyroxine  137 mcg Oral QAC breakfast  . metoprolol  50 mg Oral BID  . sodium chloride  3 mL Intravenous Q12H  . warfarin  3 mg Oral ONCE-1800  . Warfarin - Pharmacist  Dosing Inpatient   Does not apply Q24H   Continuous Infusions:   Active Problems:   HYPOTHYROIDISM   PAROXYSMAL ATRIAL FIBRILLATION   A-fib   Tachycardia-bradycardia   Bell's palsy   Herpes simplex complication   Time spent 20 minutes

## 2013-05-10 NOTE — Care Management Note (Signed)
    Page 1 of 1   05/11/2013     4:21:20 PM   CARE MANAGEMENT NOTE 05/11/2013  [name], [name]   Account Number:  [id number]  Date Initiated:  05/10/2013  Documentation initiated by:  Anibal Henderson  Subjective/Objective Assessment:   Admitted with shingles of the mouth and bells Palsy affecting lt side of face,  mouth and eye. On IV zovirax. Pt is from home,alone, but with assistance 5 days a week and good family support. She plans to return home at D/C     Action/Plan:   no CM needs noted   Anticipated DC Date:  05/11/2013   Anticipated DC Plan:  HOME/SELF CARE      DC Planning Services  CM consult      Choice offered to / List presented to:  C-1 Patient        HH arranged  HH-2 PT      HH agency  [facility]   Status of service:  Completed, signed off Medicare Important Message given?   (If response is "NO", the following Medicare IM given date fields will be blank) Date Medicare IM given:   Date Additional Medicare IM given:    Discharge Disposition:  HOME W HOME HEALTH SERVICES  Per UR Regulation:  Reviewed for med. necessity/level of care/duration of stay  If discussed at Long Length of Stay Meetings, dates discussed:    Comments:  05/10/13 1130 Anibal Henderson RN/CM

## 2013-05-10 NOTE — Progress Notes (Signed)
UR Chart Review Completed  

## 2013-05-10 NOTE — Progress Notes (Signed)
ANTIBIOTIC CONSULT NOTE  Pharmacy Consult for Valtrex Indication: Mucocutaneous HSV  Allergies  Allergen Reactions  . Codeine Nausea Only    GI UPSET    Patient Measurements: Height: 5\' 6"  (167.6 cm) Weight: 123 lb 14.4 oz (56.2 kg) IBW/kg (Calculated) : 59.3  Vital Signs: Temp: 97.3 F (36.3 C) (09/04 0639) Temp src: Oral (09/04 0256) BP: 160/71 mmHg (09/04 0639) Pulse Rate: 65 (09/04 0639) Intake/Output from previous day: 09/03 0701 - 09/04 0700 In: -  Out: 750 [Urine:750] Intake/Output from this shift: Total I/O In: 120 [P.O.:120] Out: -   Labs:  Recent Labs  05/09/13 1656 05/10/13 0611  WBC 5.8 6.7  HGB 11.7* 12.2  PLT 158 138*  CREATININE 1.09 0.94   Estimated Creatinine Clearance: 33.2 ml/min (by C-G formula based on Cr of 0.94). No results found for this basename: VANCOTROUGH, VANCOPEAK, VANCORANDOM, GENTTROUGH, GENTPEAK, GENTRANDOM, TOBRATROUGH, TOBRAPEAK, TOBRARND, AMIKACINPEAK, AMIKACINTROU, AMIKACIN,  in the last 72 hours   Microbiology: No results found for this or any previous visit (from the past 720 hour(s)).  Medical History: Past Medical History  Diagnosis Date  . Atrial fibrillation     paroxysmal  . OA (osteoarthritis)   . Gastritis   . B12 deficiency   . Anemia   . PUD (peptic ulcer disease)   . ASCVD (arteriosclerotic cardiovascular disease)   . Spinal stenosis of lumbosacral region   . Hypothyroid   . Tachycardia-bradycardia     s/p PPM ([facility])  . CVA (cerebrovascular accident)   . Ovarian mass     benign     Medications:  Scheduled:  . amiodarone  200 mg Oral Daily  . artificial tears   Left Eye QHS  . diltiazem  360 mg Oral Daily  . furosemide  20 mg Oral BID  . isosorbide mononitrate  15 mg Oral Daily  . [START ON 05/11/2013] levothyroxine  12.5 mcg Oral 3 times weekly  . levothyroxine  137 mcg Oral QAC breakfast  . metoprolol  50 mg Oral BID  . pantoprazole  40 mg Oral Daily  . predniSONE  60 mg Oral Q breakfast   . sodium chloride  3 mL Intravenous Q12H  . valACYclovir  1,000 mg Oral Q12H  . warfarin  3 mg Oral ONCE-1800  . Warfarin - Pharmacist Dosing Inpatient   Does not apply Q24H   Assessment: 79 yo F with blisters on the roof of her mouth.  This has worsened over past few days.   Renal function is at patient's baseline, however given her advanced age is only estimated at 25-30 ml/min. MD changed to oral Valtrex.  Will adjust for renal function.   Acyclovir 9/3>>  Goal of Therapy:  Eradicate infection.  Plan:  1.  Valtrex 1gm po q12h 2.  Pharmacy to sign off  Elson Clan 05/10/2013,2:55 PM

## 2013-05-11 MED ORDER — PREDNISONE 20 MG PO TABS: 60.0000 mg | ORAL_TABLET | Freq: Every day | ORAL | Status: DC

## 2013-05-11 MED ORDER — ARTIFICIAL TEARS OP OINT: TOPICAL_OINTMENT | OPHTHALMIC | Status: DC

## 2013-05-11 MED ORDER — POLYVINYL ALCOHOL 1.4 % OP SOLN: 1.0000 [drp] | OPHTHALMIC | Status: DC | PRN

## 2013-05-11 MED ORDER — HALOPERIDOL LACTATE 5 MG/ML IJ SOLN: 2.5000 mg | Freq: Four times a day (QID) | INTRAMUSCULAR | Status: DC | PRN

## 2013-05-11 MED ORDER — WARFARIN SODIUM 6 MG PO TABS: 6.0000 mg | ORAL_TABLET | Freq: Once | ORAL | Status: DC

## 2013-05-11 MED ORDER — VALACYCLOVIR HCL 1 G PO TABS: 1000.0000 mg | ORAL_TABLET | Freq: Two times a day (BID) | ORAL | Status: DC

## 2013-05-11 NOTE — Discharge Summary (Signed)
Physician Discharge Summary  [name]:[id number] DOB: 1918/03/03 DOA: 05/09/2013  PCP: [name], MD  Admit date: 05/09/2013 Discharge date: 05/11/2013  Recommendations for Outpatient Follow-up:  1. Left facial weakness, mouth ulcers, see discussion below 2. Home health physical therapy 3. Continued following of PT/INR   Follow-up Information   Follow up with [name], MD In 2 weeks.   Specialty:  Family Medicine   Contact information:   [address] [place] [address] [phone]      Discharge Diagnoses:  1. Mouth ulcers, odynophagia, suspect shingles 2. Complete left facial weakness, suspected Bell's palsy  Discharge Condition: Improved Disposition: Home with home health physical therapy  Diet recommendation: Heart healthy  Filed Weights   05/09/13 2125 05/10/13 0639 05/11/13 0448  Weight: 56.8 kg (125 lb 3.5 oz) 56.2 kg (123 lb 14.4 oz) 54.6 kg (120 lb 5.9 oz)    History of present illness:  79 year old woman presented with sores in her mouth, left-sided facial weakness for approximately 3 days.  Hospital Course:  [name] was admitted for further evaluation of left facial weakness. She had no other focal deficits. CT of the head was negative. MRI could not be obtained secondary to medical device implantation. Her history and physical exam findings are highly suggestive of shingles and Bell's palsy. Empiric treatment was started. No findings to suggest stroke. She did well physical therapy, declines 24 hour assistance and is not stable for discharge.  1. Mouth ulcers, odynophagia: Somewhat improved. Etiology unclear, shingles or HSV considered. This appears to be limited to one "dermatome". There is no involvement of the face or the left eye. Continue empiric treatment with valacyclovir, prednisone. 2. Complete Left facial weakness: Consistent with Bell's palsy, no other focal monitor deficits to suggest central process. CT of the head was negative,  patient has pacemaker. Possibly related to either HSV or herpes zoster. Treatment is supportive and pharmacologic as above. 3. Generalized weakness: Did well with physical therapy. 4. Chest pain: Resolved. She has intermittent chest pain as an outpatient, no change. History suggests chronic stable angina. No further evaluation suggested. 5. Atrial fibrillation: Warfarin per pharmacy. 6. History of cardiovascular disease 7. History of stroke   Consultants:  Physical therapy Procedures: none  Discharge Instructions  Discharge Orders   Future Appointments Provider Department Dept Phone   05/14/2013 2:00 PM Ernestina Penna, MD [facility] [phone]   05/14/2013 2:00 PM [facility] Pharmacist [facility] [phone]   Future Orders Complete By Expires   Activity as tolerated - No restrictions  As directed    Diet - low sodium heart healthy  As directed    Discharge instructions  As directed    Comments:     Be sure to finish prednisone and valacyclovir which have been prescribed for left facial weakness. Recommend patching the left eye and applying ointment to left eye each night. Liberal use of artificial tears to the left eye during the day. Because you cannot fully close your left eye be careful not to let it dry out. Call your physician or seek immediate medical attention for weakness, worsening of condition.       Medication List         acetaminophen 500 MG tablet  Commonly known as:  TYLENOL  Take 500 mg by mouth every 6 (six) hours as needed for pain.     amiodarone 200 MG tablet  Commonly known as:  PACERONE  Take 1 tablet (200  mg total) by mouth daily.     artificial tears Oint ophthalmic ointment  Apply to left eye at bedtime.     diltiazem 360 MG 24 hr capsule  Commonly known as:  TIAZAC  Take 360 mg by mouth daily.     furosemide 20 MG tablet  Commonly known as:  LASIX  Take 20 mg by mouth 2 (two) times daily. Morning  and 4pm     HYDROcodone-acetaminophen 5-325 MG per tablet  Commonly known as:  NORCO  TAKE 1/2 TABLET TWICE DAILY AS NEEDED     IRON PO  Take 1 tablet by mouth daily.     isosorbide mononitrate 30 MG 24 hr tablet  Commonly known as:  IMDUR  Take 15 mg by mouth daily.     levothyroxine 137 MCG tablet  Commonly known as:  SYNTHROID, LEVOTHROID  Take 137 mcg by mouth daily.     levothyroxine 25 MCG tablet  Commonly known as:  SYNTHROID, LEVOTHROID  Take 12.5 mcg by mouth daily. Patient takes on Monday,Wednesday,and Friday along with other thyroid tablet.     meclizine 12.5 MG tablet  Commonly known as:  ANTIVERT  Take 12.5 mg by mouth as needed for dizziness.     metoprolol 50 MG tablet  Commonly known as:  LOPRESSOR  Take 50 mg by mouth 2 (two) times daily.     nitroGLYCERIN 0.4 MG SL tablet  Commonly known as:  NITROSTAT  Place 0.4 mg under the tongue every 5 (five) minutes as needed for chest pain.     polyvinyl alcohol 1.4 % ophthalmic solution  Commonly known as:  LIQUIFILM TEARS  Place 1 drop into the left eye as needed.     predniSONE 20 MG tablet  Commonly known as:  DELTASONE  Take 3 tablets (60 mg total) by mouth daily with breakfast. Start 9/6.     promethazine 25 MG tablet  Commonly known as:  PHENERGAN  Take 1 tablet (25 mg total) by mouth every 6 (six) hours as needed. Take half tablet by mouth as needed     ranitidine 75 MG tablet  Commonly known as:  ZANTAC 75  Take 1 tablet (75 mg total) by mouth 2 (two) times daily.     traMADol 50 MG tablet  Commonly known as:  ULTRAM  Take 50 mg by mouth 4 (four) times daily as needed for pain.     valACYclovir 1000 MG tablet  Commonly known as:  VALTREX  Take 1 tablet (1,000 mg total) by mouth every 12 (twelve) hours.     Vitamin D 2000 UNITS tablet  Take 2,000 Units by mouth daily.     warfarin 3 MG tablet  Commonly known as:  COUMADIN  Take 1 tablet by mouth daily.       Allergies  Allergen  Reactions  . Codeine Nausea Only    GI UPSET    The results of significant diagnostics from this hospitalization (including imaging, microbiology, ancillary and laboratory) are listed below for reference.    Significant Diagnostic Studies: Ct Head Wo Contrast  05/09/2013   *RADIOLOGY REPORT*  Clinical Data: Left facial droop.  History of stroke.  CT HEAD WITHOUT CONTRAST  Technique:  Contiguous axial images were obtained from the base of the skull through the vertex without contrast.  Comparison: CT scan dated 04/10/2011  Findings: There is no acute intracranial hemorrhage, infarction, or mass lesion.  There is an old left occipital infarct with secondary encephalomalacia.  There  is diffuse cerebral cortical atrophy with secondary ventricular dilatation, stable.  No acute osseous abnormality.  There are a few tiny white matter lucencies in the superior aspects of the basal ganglia bilaterally which are stable and consistent with chronic small vessel ischemic disease.  IMPRESSION: No acute intracranial abnormality.   Original Report Authenticated By: Francene Boyers, M.D.   Labs: Basic Metabolic Panel:  Recent Labs Lab 05/09/13 1656 05/10/13 0611  NA 138 138  K 3.9 3.9  CL 99 102  CO2 31 30  GLUCOSE 98 95  BUN 23 19  CREATININE 1.09 0.94  CALCIUM 9.0 9.0  MG  --  2.0   Liver Function Tests:  Recent Labs Lab 05/10/13 0611  AST 34  ALT 16  ALKPHOS 74  BILITOT 0.6  PROT 6.6  ALBUMIN 3.0*   CBC:  Recent Labs Lab 05/09/13 1656 05/10/13 0611  WBC 5.8 6.7  NEUTROABS 3.8  --   HGB 11.7* 12.2  HCT 36.3 38.3  MCV 88.3 88.2  PLT 158 138*   Cardiac Enzymes:  Recent Labs Lab 05/09/13 1942  TROPONINI <0.30    Active Problems:   HYPOTHYROIDISM   PAROXYSMAL ATRIAL FIBRILLATION   A-fib   Tachycardia-bradycardia   Bell's palsy   Herpes simplex complication   Time coordinating discharge: 25 minutes  Signed:  Brendia Sacks, MD [facility] 05/11/2013, 11:38  AM

## 2013-05-11 NOTE — Progress Notes (Signed)
Physical Therapy Treatment Patient Details Name: [name] MRN: [id number] DOB: 1918/07/31 Today's Date: 05/11/2013 Time: 4098-1191 PT Time Calculation (min): 27 min  PT Assessment / Plan / Recommendation  History of Present Illness     PT Comments   Pt was seen for gait evaluation/training.  She has been quite confused per RN report, but currently she is back to baseline.  It was very difficult for me to convince her to work with me...she wanted to sleep.  She was finally willing to participate and was able to transfer OOB without assistance and ambulated 53' with a walker and no instability.  She is, however, very frail with a hx of falls.  With her intermittent confusion (daughter-in-law states pt is often confused at home) and advanced age, she should have full time assistance at home.  I discussed this with the pt but she was unwilling to acknowledge any reason for change.  Would recommend HHPT for evaluation in her home.  Follow Up Recommendations  Home health PT     Does the patient have the potential to tolerate intense rehabilitation   no  Barriers to Discharge        Equipment Recommendations  None recommended by PT    Recommendations for Other Services    Frequency  (all further needs can be met at home)   Progress towards PT Goals Progress towards PT goals: Goals met/education completed, patient discharged from PT  Plan  (plan to return home)    Precautions / Restrictions Restrictions Weight Bearing Restrictions: No   Pertinent Vitals/Pain     Mobility  Bed Mobility Bed Mobility: Supine to Sit Supine to Sit: 6: Modified independent (Device/Increase time) Details for Bed Mobility Assistance: transfer is slow but no assist is needed. Transfers Transfers: Sit to Stand;Stand to Sit Sit to Stand: 6: Modified independent (Device/Increase time);From bed Stand to Sit: 6: Modified independent (Device/Increase time);To chair/3-in-1 Ambulation/Gait Ambulation/Gait  Assistance: 6: Modified independent (Device/Increase time) Ambulation Distance (Feet): 80 Feet Assistive device: Rolling walker Gait Pattern: Trunk flexed Gait velocity: WNL General Gait Details: gait is extremely kyphotic but had no LOB Stairs: No Wheelchair Mobility Wheelchair Mobility: No    Exercises     PT Diagnosis:    PT Problem List:   PT Treatment Interventions:     PT Goals (current goals can now be found in the care plan section)    Visit Information  Last PT Received On: 05/11/13    Subjective Data      Cognition  Cognition Arousal/Alertness: Awake/alert Behavior During Therapy: [facility] for tasks assessed/performed Overall Cognitive Status: Within Functional Limits for tasks assessed (RN states that pt had been confused earlier)    Balance     End of Session PT - End of Session Equipment Utilized During Treatment: Gait belt Activity Tolerance: Patient tolerated treatment well Patient left: in chair;with call bell/phone within reach;with chair alarm set Nurse Communication: Mobility status   GP     Myrlene Broker L 05/11/2013, 10:16 AM

## 2013-05-11 NOTE — Progress Notes (Signed)
D/c instructions reviewed with patient and family.  Verbalized understanding.  Pt dc'd to home with family. Schonewitz, Candelaria Stagers 05/11/2013

## 2013-05-11 NOTE — Progress Notes (Signed)
[facility] PROGRESS NOTE  MIKEA QUADROS ZOX:[id number] DOB: 23-Jul-1918 DOA: 05/09/2013 PCP: [name], MD  Assessment/Plan: 1. Mouth ulcers, odynophagia: Somewhat improved. Etiology unclear, shingles or HSV considered. This appears to be limited to one "dermatome". There is no involvement of the face or the left eye. Continue empiric treatment with valacyclovir, prednisone. 2. Complete Left facial weakness: Consistent with Bell's palsy, no other focal monitor deficits to suggest central process. CT of the head was negative, patient has pacemaker. Possibly related to either HSV or herpes zoster. Treatment is supportive and pharmacologic as above. 3. Generalized weakness: Did well with physical therapy. 4. Chest pain: Resolved. She has intermittent chest pain as an outpatient, no change. History suggests chronic stable angina. No further evaluation suggested. 5. Atrial fibrillation: Warfarin per pharmacy. 6. History of cardiovascular disease 7. History of stroke   Home with home health physical therapy  Pending studies:   None  Code Status: Full code DVT prophylaxis: SCDs Family Communication: Discussed with son, daughter-in-law at bedside. We reviewed testing, diagnosis and treatment plan. We discussed that facial weakness may not resolve. Disposition Plan: Home 9/5  Brendia Sacks, MD  [facility]  Pager [phone] If 7PM-7AM, please contact night-coverage at [email or web address], password [facility] 05/11/2013, 11:25 AM  LOS: 2 days   Summary: 79 year old woman presented with sores in her mouth, left-sided facial weakness for approximately 3 days.  Consultants:  Physical therapy  Procedures:    Antibiotics:  Acyclovir 9/3 >> 9/4  Valacyclovir 9/4 >>   HPI/Subjective: Overall feels okay. Mouth sores feels somewhat better today. Did well physical therapy. Mild confusion overnight has resolved. Family reports that this always occurs in the hospital  setting.  Objective: Filed Vitals:   05/10/13 0639 05/10/13 2115 05/11/13 0310 05/11/13 0448  BP: 160/71 133/67 156/74 148/69  Pulse: 65 60 60 60  Temp: 97.3 F (36.3 C) 97.6 F (36.4 C) 97.7 F (36.5 C) 98.1 F (36.7 C)  TempSrc:  Oral Oral Oral  Resp: 22 20 20 20   Height:      Weight: 56.2 kg (123 lb 14.4 oz)   54.6 kg (120 lb 5.9 oz)  SpO2: 91% 92% 95% 94%    Intake/Output Summary (Last 24 hours) at 05/11/13 1125 Last data filed at 05/10/13 2200  Gross per 24 hour  Intake    225 ml  Output   1325 ml  Net  -1100 ml     Filed Weights   05/09/13 2125 05/10/13 0639 05/11/13 0448  Weight: 56.8 kg (125 lb 3.5 oz) 56.2 kg (123 lb 14.4 oz) 54.6 kg (120 lb 5.9 oz)    Exam:   Afebrile, vital signs stable  General: Appears calm and comfortable. Sitting in chair.  Cardiovascular: Regular rate and rhythm. No rub or gallop. 2/6 systolic murmur.  Respiratory: Clear to auscultation bilaterally. No wheezes, rales, rhonchi. Normal respiratory effort.  Head: Complete left facial paralysis without significant change today. No rash on the face or involvement of the left eye.  ENT: White ulcers left side of the hard palate appears somewhat better today. No other mucous membrane involvement in the mouth.  Data Reviewed:  INR 1.87  Scheduled Meds: . amiodarone  200 mg Oral Daily  . artificial tears   Left Eye QHS  . diltiazem  360 mg Oral Daily  . furosemide  20 mg Oral BID  . isosorbide mononitrate  15 mg Oral Daily  . levothyroxine  12.5 mcg Oral 3 times weekly  . levothyroxine  137  mcg Oral QAC breakfast  . metoprolol  50 mg Oral BID  . pantoprazole  40 mg Oral Daily  . predniSONE  60 mg Oral Q breakfast  . sodium chloride  3 mL Intravenous Q12H  . valACYclovir  1,000 mg Oral Q12H  . warfarin  6 mg Oral ONCE-1800  . Warfarin - Pharmacist Dosing Inpatient   Does not apply Q24H   Continuous Infusions:   Active Problems:   HYPOTHYROIDISM   PAROXYSMAL ATRIAL  FIBRILLATION   A-fib   Tachycardia-bradycardia   Bell's palsy   Herpes simplex complication

## 2013-05-11 NOTE — Progress Notes (Signed)
ANTICOAGULATION CONSULT NOTE  Pharmacy Consult for Coumadin Indication: atrial fibrillation  Allergies  Allergen Reactions  . Codeine Nausea Only    GI UPSET    Patient Measurements: Height: 5\' 6"  (167.6 cm) Weight: 120 lb 5.9 oz (54.6 kg) IBW/kg (Calculated) : 59.3  Vital Signs: Temp: 98.1 F (36.7 C) (09/05 0448) Temp src: Oral (09/05 0448) BP: 148/69 mmHg (09/05 0448) Pulse Rate: 60 (09/05 0448)  Labs:  Recent Labs  05/09/13 1656 05/09/13 1942 05/10/13 0611 05/11/13 0602  HGB 11.7*  --  12.2  --   HCT 36.3  --  38.3  --   PLT 158  --  138*  --   LABPROT 23.6*  --  21.5* 21.0*  INR 2.18*  --  1.93* 1.87*  CREATININE 1.09  --  0.94  --   TROPONINI  --  <0.30  --   --     Estimated Creatinine Clearance: 32.2 ml/min (by C-G formula based on Cr of 0.94).   Medical History: Past Medical History  Diagnosis Date  . Atrial fibrillation     paroxysmal  . OA (osteoarthritis)   . Gastritis   . B12 deficiency   . Anemia   . PUD (peptic ulcer disease)   . ASCVD (arteriosclerotic cardiovascular disease)   . Spinal stenosis of lumbosacral region   . Hypothyroid   . Tachycardia-bradycardia     s/p PPM ([facility])  . CVA (cerebrovascular accident)   . Ovarian mass     benign     Medications:  Scheduled:  . amiodarone  200 mg Oral Daily  . artificial tears   Left Eye QHS  . diltiazem  360 mg Oral Daily  . furosemide  20 mg Oral BID  . isosorbide mononitrate  15 mg Oral Daily  . levothyroxine  12.5 mcg Oral 3 times weekly  . levothyroxine  137 mcg Oral QAC breakfast  . metoprolol  50 mg Oral BID  . pantoprazole  40 mg Oral Daily  . predniSONE  60 mg Oral Q breakfast  . sodium chloride  3 mL Intravenous Q12H  . valACYclovir  1,000 mg Oral Q12H  . warfarin  6 mg Oral ONCE-1800  . Warfarin - Pharmacist Dosing Inpatient   Does not apply Q24H    Assessment: 79 yo F on chronic warfarin 3mg  daily for Afib.  INR therapeutic on admission, but has trended to  sub-therapeutic levels during her hospital stay.  No bleeding noted. Will increase warfarin dose today.  Goal of Therapy:  INR 2-3   Plan:  1.  Coumadin 6mg  po x1 today 2.  INR daily for now  Elson Clan 05/11/2013,8:33 AM

## 2013-05-14 ENCOUNTER — Ambulatory Visit (INDEPENDENT_AMBULATORY_CARE_PROVIDER_SITE_OTHER): Payer: Medicare Other | Admitting: Family Medicine

## 2013-05-14 ENCOUNTER — Other Ambulatory Visit: Payer: Self-pay | Admitting: Family Medicine

## 2013-05-14 ENCOUNTER — Ambulatory Visit (INDEPENDENT_AMBULATORY_CARE_PROVIDER_SITE_OTHER): Payer: Self-pay | Admitting: Pharmacist

## 2013-05-14 ENCOUNTER — Encounter: Payer: Self-pay | Admitting: Family Medicine

## 2013-05-14 VITALS — BP 111/53 | HR 59 | Temp 96.8°F | Ht 66.0 in | Wt 120.0 lb

## 2013-05-14 DIAGNOSIS — Z79899 Other long term (current) drug therapy: Secondary | ICD-10-CM

## 2013-05-14 DIAGNOSIS — I10 Essential (primary) hypertension: Secondary | ICD-10-CM

## 2013-05-14 DIAGNOSIS — I4891 Unspecified atrial fibrillation: Secondary | ICD-10-CM

## 2013-05-14 DIAGNOSIS — G510 Bell's palsy: Secondary | ICD-10-CM

## 2013-05-14 DIAGNOSIS — M1990 Unspecified osteoarthritis, unspecified site: Secondary | ICD-10-CM

## 2013-05-14 DIAGNOSIS — E039 Hypothyroidism, unspecified: Secondary | ICD-10-CM

## 2013-05-14 DIAGNOSIS — M48061 Spinal stenosis, lumbar region without neurogenic claudication: Secondary | ICD-10-CM

## 2013-05-14 DIAGNOSIS — E538 Deficiency of other specified B group vitamins: Secondary | ICD-10-CM

## 2013-05-14 LAB — POCT CBC
Granulocyte percent: 63 %G (ref 37–80)
Lymph, poc: 1.3 (ref 0.6–3.4)
MCHC: 33.3 g/dL (ref 31.8–35.4)
MCV: 84.3 fL (ref 80–97)
POC Granulocyte: 6.5 (ref 2–6.9)
POC LYMPH PERCENT: 12.4 %L (ref 10–50)
Platelet Count, POC: 158 10*3/uL (ref 142–424)
RDW, POC: 16.1 %

## 2013-05-14 NOTE — Patient Instructions (Signed)
Continue with physical therapy at home Be careful and did not put yourself at risk for falling He constantly aware of any bleeding issues especially with Coumadin and prednisone. Consider assisted living  Placement Drink plenty of fluids Exercise facial muscles Patch left eye lids together to keep from being too dried out Use artificial tears regularly

## 2013-05-14 NOTE — Progress Notes (Signed)
Patient saw Dr Christell Constant.  Only charge for INR not office visit for [facility]

## 2013-05-14 NOTE — Patient Instructions (Addendum)
Anticoagulation Dose Instructions as of 05/14/2013     [name] Tue Wed Thu Fri Sat   New Dose 3 mg 3 mg 3 mg 1.5 mg 3 mg 3 mg 3 mg    Description       No warfarin tomorrow 05/15/13 or Wednesday 05/16/13, then take 1/2 tablet until finished with prednisone.  After finished with prednisone restart 1 tablet daily except 1/2 tablet on wednesdays. [facility] to check next week.      INR was 4.3 today

## 2013-05-14 NOTE — Progress Notes (Signed)
Subjective:    Patient ID: [name], female    DOB: Aug 06, 1918, 79 y.o.   MRN: [id number]  HPI Patient comes in today for followup and management of chronic medical problems. She comes in in a wheelchair with her son and daughter-in-law. She had a recent stay at the hospital because of herpes simplex or shingles and what is thought to be Bell's palsy. She is actually on prednisone for the Bell's palsy. She is on valacyclovir for the shingle   Review of Systems  Constitutional: Negative for activity change.  HENT: Negative.        Bells pausy  Eyes: Positive for pain (shingles).  Respiratory: Negative.   Cardiovascular: Negative.   Gastrointestinal: Negative.   Endocrine: Negative.   Genitourinary: Negative.   Musculoskeletal: Negative.   Skin: Negative.   Allergic/Immunologic: Negative.   Neurological: Positive for speech difficulty and weakness (left side facial droop).       Shingles  Hematological: Negative.   Psychiatric/Behavioral: Negative.        Objective:   Physical Exam  BP 111/53  Pulse 59  Temp(Src) 96.8 F (36 C) (Oral)  Ht 5\' 6"  (1.676 m)  Wt 120 lb (54.432 kg)  BMI 19.38 kg/m2  The patient appeared elderly and frail, but was alert and oriented to time and place. Speech, behavior and judgement appear normal. Her face was drooping considerably on the left side. Her was opened and she was unable to close this. There was cerumen in the left ear canal and the TM was not visible. Vital signs as documented.  Head exam is unremarkable. No scleral icterus or pallor noted. The left cheek inside the mouth had clusters of pustules or ulcers. Neck is without jugular venous distension, thyromegally, or carotid bruits. No cervical adenopathy. Lungs are clear anteriorly and posteriorly to auscultation. Normal respiratory effort. Cardiac exam reveals regular rate and rhythm irregularly irregular at about 72 per minute. First and second heart sounds normal. No murmurs,  rubs or gallops.  Abdominal exam reveals normal bowl sounds, no masses, no organomegaly and no aortic enlargement. The abdomen has slight epigastric tenderness and a lot of gas to palpation. Extremities are minimally edematous.  Skin without pallor or jaundice.  Warm and dry, without rash. There was a rash inside the left buccal mucosa as indicated above.  There was a discussion today regarding assisted living placement and the cost of this versus staying at home. This conversation was initiated by her son and the patient agreed that this would be the best place for her. She is concerned about her well-being but more so about her son and daughter-in-law's well-being as far as her care is concerned. She will consider this further and let me know if we need to fill an FL2 form.        Assessment & Plan:  1. HYPOTHYROIDISM  2. HYPERTENSION, UNSPECIFIED  3. OSTEOARTHRITIS  4. Bell's palsy - POCT CBC  5. A-fib - POCT INR  6. B12 deficiency  7. Spinal stenosis of lumbar region  8. High risk medication use - POCT CBC  Patient Instructions  Continue with physical therapy at home Be careful and did not put yourself at risk for falling He constantly aware of any bleeding issues especially with Coumadin and prednisone. Consider assisted living  Placement Drink plenty of fluids Exercise facial muscles Patch left eye lids together to keep from being too dried out Use artificial tears regularly   Nyra Capes MD

## 2013-05-17 ENCOUNTER — Telehealth: Payer: Self-pay | Admitting: *Deleted

## 2013-05-17 ENCOUNTER — Ambulatory Visit: Payer: Medicare Other | Admitting: Pharmacist

## 2013-05-17 DIAGNOSIS — I4891 Unspecified atrial fibrillation: Secondary | ICD-10-CM

## 2013-05-17 LAB — POCT INR: INR: 2.4

## 2013-05-17 NOTE — Telephone Encounter (Signed)
PT 28.3  INR 2.4  Completed round of prednisone yesterday.  Previous instructions were: 3mg  daily except 1/2 tab on Wednesday.

## 2013-05-17 NOTE — Telephone Encounter (Signed)
Resume 3mg  daily except 1.5mg  on Wednesdays as planned.

## 2013-05-17 NOTE — Progress Notes (Signed)
No charge - INR was performed by [facility].

## 2013-05-21 ENCOUNTER — Ambulatory Visit: Payer: Medicare Other | Admitting: Pharmacist

## 2013-05-21 ENCOUNTER — Telehealth: Payer: Self-pay | Admitting: Family Medicine

## 2013-05-21 DIAGNOSIS — I4891 Unspecified atrial fibrillation: Secondary | ICD-10-CM

## 2013-05-21 LAB — POCT INR: INR: 2.5

## 2013-05-21 NOTE — Progress Notes (Signed)
INR checked by [facility] in patient's home.  Patient called about results and Eber Jones called.

## 2013-05-21 NOTE — Telephone Encounter (Signed)
Patient has bilateral lower extremity weeping edema.   She has an area on one leg that appears to be an open blister.  This area is a little red but it is localized to this area only now. Both legs appeared a little red earlier.  Offered appt with Dr. Modesto Charon this evening.  Patient prefers to wait and see Dr. Christell Constant tomorrow.  They will alert [place] if any new problems develop. Asked her to elevate her legs and to keep them clean and as dry as possible.  They can cover them in gauze. She denies any SOB and blood pressure is WNL.

## 2013-05-22 ENCOUNTER — Ambulatory Visit (INDEPENDENT_AMBULATORY_CARE_PROVIDER_SITE_OTHER): Payer: Medicare Other | Admitting: Family Medicine

## 2013-05-22 ENCOUNTER — Encounter: Payer: Self-pay | Admitting: Family Medicine

## 2013-05-22 ENCOUNTER — Ambulatory Visit (INDEPENDENT_AMBULATORY_CARE_PROVIDER_SITE_OTHER): Payer: Medicare Other

## 2013-05-22 VITALS — BP 131/64 | HR 59 | Temp 98.3°F | Ht 66.0 in | Wt 120.8 lb

## 2013-05-22 DIAGNOSIS — I10 Essential (primary) hypertension: Secondary | ICD-10-CM

## 2013-05-22 DIAGNOSIS — R609 Edema, unspecified: Secondary | ICD-10-CM

## 2013-05-22 DIAGNOSIS — I2510 Atherosclerotic heart disease of native coronary artery without angina pectoris: Secondary | ICD-10-CM

## 2013-05-22 DIAGNOSIS — R0602 Shortness of breath: Secondary | ICD-10-CM

## 2013-05-22 DIAGNOSIS — I7090 Unspecified atherosclerosis: Secondary | ICD-10-CM

## 2013-05-22 LAB — POCT CBC
HCT, POC: 36.7 % — AB (ref 37.7–47.9)
Hemoglobin: 12 g/dL — AB (ref 12.2–16.2)
MCHC: 32.8 g/dL (ref 31.8–35.4)
MPV: 8.4 fL (ref 0–99.8)
POC Granulocyte: 6.2 (ref 2–6.9)
POC LYMPH PERCENT: 15.7 %L (ref 10–50)
RBC: 4.2 M/uL (ref 4.04–5.48)

## 2013-05-22 NOTE — Patient Instructions (Addendum)
Take fluid pill (lasix 20mg  ) twice a day REGULARLY. Clean sore on leg with saline and wet to dry and wrap with dry gauze. Discussed nursing home options again with patient and family present Discuss with patient about exercising facial muscles with mirror and taping the eye lids together at nighttime to keep eye from drying out

## 2013-05-22 NOTE — Progress Notes (Addendum)
  Subjective:    Patient ID: [name], female    DOB: 20-Feb-1918, 79 y.o.   MRN: [id number]  HPI Patient comes in today with her son and daughter-in-law in a wheelchair. She has had weeping edema from her legs for 3 days. Her weight compared to the last visit his only 8 tenths of a pound. She complains of no significant increase of shortness of breath. As of note the patient did finish her prednisone about 6 days ago from the hospital admission. She has also finished her valacyclovir. Also as of note she has not been taking her fluid pill.   Review of Systems  Constitutional: Negative.   HENT: Negative.   Eyes: Negative.   Respiratory: Negative.  Negative for shortness of breath.   Cardiovascular: Positive for leg swelling (legs and ankles - started saturday).  Gastrointestinal: Negative.   Endocrine: Negative.   Genitourinary: Negative.   Musculoskeletal: Negative.   Skin: Negative.   Allergic/Immunologic: Negative.   Neurological: Negative.   Hematological: Negative.   Psychiatric/Behavioral: Negative.        Objective:   Physical Exam  Constitutional: She is oriented to person, place, and time. No distress.  Elderly frail female in a wheelchair  HENT:  Head: Normocephalic.  Drooping left cheek and drooping left lower lid secondary to Bell's palsy  Eyes: Conjunctivae are normal. Right eye exhibits no discharge. Left eye exhibits no discharge. No scleral icterus.  Neck: Normal range of motion. Neck supple.  Cardiovascular: Normal rate and regular rhythm.  Exam reveals no gallop and no friction rub.   Murmur heard. Systolic ejection murmur  Pulmonary/Chest: Effort normal and breath sounds normal. No respiratory distress. She has no wheezes. She has no rales.  Musculoskeletal: She exhibits edema.  4+ pitting edema, weeping  left lower extremity  Lymphadenopathy:    She has no cervical adenopathy.  Neurological: She is alert and oriented to person, place, and time.  Skin:  Skin is warm and dry.  Psychiatric: She has a normal mood and affect. Her behavior is normal. Judgment and thought content normal.  Normal demeanor for this patient   [facility] reading (PRIMARY) by  Dr.Dayla Gasca: Chest x-ray:                                       Assessment & Plan:  1. Edema, probably secondary to finishing a course of prednisone and not taking fluid pill regularly - POCT CBC - Brain natriuretic peptide - BMP8+EGFR - Hepatic function panel - DG Chest 2 View; Future  2. HYPERTENSION, UNSPECIFIED- POCT CBC - Brain natriuretic peptide - BMP8+EGFR - Hepatic function panel - DG Chest 2 View; Future  3. ASCVD (arteriosclerotic cardiovascular disease)  - POCT CBC - Brain natriuretic peptide - BMP8+EGFR - Hepatic function panel - DG Chest 2 View; Future  4. Shortness of breath, minimal - Brain natriuretic peptide - DG Chest 2 View; Future  Patient Instructions  Take fluid pill (lasix 20mg  ) twice a day REGULARLY. Clean sore on leg with saline and wet to dry and wrap with dry gauze. Discussed nursing home options again with patient and family present Discuss with patient about exercising facial muscles with mirror and taping the eye lids together at nighttime to keep eye from drying out   Nyra Capes MD

## 2013-05-23 LAB — BMP8+EGFR
BUN: 26 mg/dL (ref 10–36)
CO2: 23 mmol/L (ref 18–29)
Calcium: 9.2 mg/dL (ref 8.6–10.2)
Creatinine, Ser: 1.15 mg/dL — ABNORMAL HIGH (ref 0.57–1.00)
GFR calc non Af Amer: 41 mL/min/{1.73_m2} — ABNORMAL LOW (ref >59)
Sodium: 141 mmol/L (ref 134–144)

## 2013-05-23 LAB — HEPATIC FUNCTION PANEL
Albumin: 3.8 g/dL (ref 3.2–4.6)
Bilirubin, Direct: 0.11 mg/dL (ref 0.00–0.40)
Total Bilirubin: 0.3 mg/dL (ref 0.0–1.2)
Total Protein: 6.3 g/dL (ref 6.0–8.5)

## 2013-05-23 LAB — BRAIN NATRIURETIC PEPTIDE: BNP: 1037.4 pg/mL — ABNORMAL HIGH (ref 0.0–100.0)

## 2013-05-29 ENCOUNTER — Ambulatory Visit (INDEPENDENT_AMBULATORY_CARE_PROVIDER_SITE_OTHER): Payer: Medicare Other | Admitting: Family Medicine

## 2013-05-29 ENCOUNTER — Inpatient Hospital Stay (HOSPITAL_COMMUNITY)
Admission: AD | Admit: 2013-05-29 | Discharge: 2013-06-01 | DRG: 293 | Disposition: A | Discharge status: Nursing Home (Includes ICF) | Payer: Medicare Other | Source: Ambulatory Visit | Attending: Internal Medicine | Admitting: Internal Medicine

## 2013-05-29 ENCOUNTER — Encounter: Payer: Self-pay | Admitting: Family Medicine

## 2013-05-29 ENCOUNTER — Encounter (HOSPITAL_COMMUNITY): Payer: Self-pay | Admitting: *Deleted

## 2013-05-29 VITALS — BP 100/56 | HR 59 | Temp 96.8°F | Ht 66.0 in | Wt 116.0 lb

## 2013-05-29 DIAGNOSIS — M1990 Unspecified osteoarthritis, unspecified site: Secondary | ICD-10-CM

## 2013-05-29 DIAGNOSIS — I10 Essential (primary) hypertension: Secondary | ICD-10-CM

## 2013-05-29 DIAGNOSIS — Z7901 Long term (current) use of anticoagulants: Secondary | ICD-10-CM

## 2013-05-29 DIAGNOSIS — I209 Angina pectoris, unspecified: Secondary | ICD-10-CM

## 2013-05-29 DIAGNOSIS — R609 Edema, unspecified: Secondary | ICD-10-CM

## 2013-05-29 DIAGNOSIS — Z90710 Acquired absence of both cervix and uterus: Secondary | ICD-10-CM

## 2013-05-29 DIAGNOSIS — E039 Hypothyroidism, unspecified: Secondary | ICD-10-CM

## 2013-05-29 DIAGNOSIS — Z66 Do not resuscitate: Secondary | ICD-10-CM | POA: Diagnosis present

## 2013-05-29 DIAGNOSIS — G510 Bell's palsy: Secondary | ICD-10-CM | POA: Diagnosis present

## 2013-05-29 DIAGNOSIS — R5381 Other malaise: Secondary | ICD-10-CM

## 2013-05-29 DIAGNOSIS — R600 Localized edema: Secondary | ICD-10-CM | POA: Diagnosis present

## 2013-05-29 DIAGNOSIS — I872 Venous insufficiency (chronic) (peripheral): Secondary | ICD-10-CM | POA: Diagnosis present

## 2013-05-29 DIAGNOSIS — Z79899 Other long term (current) drug therapy: Secondary | ICD-10-CM

## 2013-05-29 DIAGNOSIS — J029 Acute pharyngitis, unspecified: Secondary | ICD-10-CM

## 2013-05-29 DIAGNOSIS — I2089 Other forms of angina pectoris: Secondary | ICD-10-CM | POA: Diagnosis present

## 2013-05-29 DIAGNOSIS — L02419 Cutaneous abscess of limb, unspecified: Secondary | ICD-10-CM

## 2013-05-29 DIAGNOSIS — Z87891 Personal history of nicotine dependence: Secondary | ICD-10-CM

## 2013-05-29 DIAGNOSIS — I4891 Unspecified atrial fibrillation: Secondary | ICD-10-CM | POA: Diagnosis present

## 2013-05-29 DIAGNOSIS — M48061 Spinal stenosis, lumbar region without neurogenic claudication: Secondary | ICD-10-CM

## 2013-05-29 DIAGNOSIS — R0600 Dyspnea, unspecified: Secondary | ICD-10-CM

## 2013-05-29 DIAGNOSIS — Z8669 Personal history of other diseases of the nervous system and sense organs: Secondary | ICD-10-CM

## 2013-05-29 DIAGNOSIS — I509 Heart failure, unspecified: Secondary | ICD-10-CM | POA: Diagnosis present

## 2013-05-29 DIAGNOSIS — D649 Anemia, unspecified: Secondary | ICD-10-CM | POA: Diagnosis present

## 2013-05-29 DIAGNOSIS — I2510 Atherosclerotic heart disease of native coronary artery without angina pectoris: Secondary | ICD-10-CM | POA: Diagnosis present

## 2013-05-29 DIAGNOSIS — G9001 Carotid sinus syncope: Secondary | ICD-10-CM

## 2013-05-29 DIAGNOSIS — Z8673 Personal history of transient ischemic attack (TIA), and cerebral infarction without residual deficits: Secondary | ICD-10-CM

## 2013-05-29 DIAGNOSIS — R29810 Facial weakness: Secondary | ICD-10-CM

## 2013-05-29 DIAGNOSIS — I5043 Acute on chronic combined systolic (congestive) and diastolic (congestive) heart failure: Principal | ICD-10-CM | POA: Diagnosis present

## 2013-05-29 DIAGNOSIS — I359 Nonrheumatic aortic valve disorder, unspecified: Secondary | ICD-10-CM | POA: Diagnosis present

## 2013-05-29 DIAGNOSIS — Z8719 Personal history of other diseases of the digestive system: Secondary | ICD-10-CM

## 2013-05-29 DIAGNOSIS — I495 Sick sinus syndrome: Secondary | ICD-10-CM

## 2013-05-29 DIAGNOSIS — D518 Other vitamin B12 deficiency anemias: Secondary | ICD-10-CM

## 2013-05-29 DIAGNOSIS — E538 Deficiency of other specified B group vitamins: Secondary | ICD-10-CM

## 2013-05-29 DIAGNOSIS — I447 Left bundle-branch block, unspecified: Secondary | ICD-10-CM

## 2013-05-29 DIAGNOSIS — Z950 Presence of cardiac pacemaker: Secondary | ICD-10-CM

## 2013-05-29 DIAGNOSIS — I6350 Cerebral infarction due to unspecified occlusion or stenosis of unspecified cerebral artery: Secondary | ICD-10-CM

## 2013-05-29 DIAGNOSIS — B0089 Other herpesviral infection: Secondary | ICD-10-CM

## 2013-05-29 DIAGNOSIS — Z87898 Personal history of other specified conditions: Secondary | ICD-10-CM

## 2013-05-29 DIAGNOSIS — I208 Other forms of angina pectoris: Secondary | ICD-10-CM | POA: Diagnosis present

## 2013-05-29 DIAGNOSIS — R079 Chest pain, unspecified: Secondary | ICD-10-CM | POA: Diagnosis present

## 2013-05-29 DIAGNOSIS — R55 Syncope and collapse: Secondary | ICD-10-CM

## 2013-05-29 DIAGNOSIS — L03116 Cellulitis of left lower limb: Secondary | ICD-10-CM

## 2013-05-29 HISTORY — DX: Acute on chronic combined systolic (congestive) and diastolic (congestive) heart failure: I50.43

## 2013-05-29 LAB — PROTIME-INR
INR: 2.72 — ABNORMAL HIGH (ref 0.00–1.49)
Prothrombin Time: 27.9 seconds — ABNORMAL HIGH (ref 11.6–15.2)

## 2013-05-29 LAB — CBC
HCT: 35.1 % — ABNORMAL LOW (ref 36.0–46.0)
Platelets: 156 10*3/uL (ref 150–400)
RBC: 3.89 MIL/uL (ref 3.87–5.11)
RDW: 16.8 % — ABNORMAL HIGH (ref 11.5–15.5)
WBC: 9.1 10*3/uL (ref 4.0–10.5)

## 2013-05-29 LAB — BASIC METABOLIC PANEL
CO2: 31 mEq/L (ref 19–32)
Chloride: 99 mEq/L (ref 96–112)
Creatinine, Ser: 1.4 mg/dL — ABNORMAL HIGH (ref 0.50–1.10)
Glucose, Bld: 104 mg/dL — ABNORMAL HIGH (ref 70–99)

## 2013-05-29 MED ORDER — LEVOTHYROXINE SODIUM 137 MCG PO TABS
137.0000 ug | ORAL_TABLET | Freq: Every day | ORAL | Status: DC
  Administered 2013-05-30 – 2013-06-01 (×3): 137 ug via ORAL
  Filled 2013-05-29 (×7): qty 1

## 2013-05-29 MED ORDER — FAMOTIDINE 20 MG PO TABS
20.0000 mg | ORAL_TABLET | Freq: Every day | ORAL | Status: DC
  Administered 2013-05-30 – 2013-05-31 (×2): 20 mg via ORAL
  Filled 2013-05-29 (×3): qty 1

## 2013-05-29 MED ORDER — ARTIFICIAL TEARS OP OINT
TOPICAL_OINTMENT | OPHTHALMIC | Status: AC
  Filled 2013-05-29: qty 3.5

## 2013-05-29 MED ORDER — POLYVINYL ALCOHOL 1.4 % OP SOLN: 1.0000 [drp] | OPHTHALMIC | Status: DC | PRN

## 2013-05-29 MED ORDER — SODIUM CHLORIDE 0.9 % IV SOLN: 250.0000 mL | INTRAVENOUS | Status: DC | PRN

## 2013-05-29 MED ORDER — DILTIAZEM HCL ER BEADS 240 MG PO CP24: 360.0000 mg | ORAL_CAPSULE | Freq: Every day | ORAL | Status: DC

## 2013-05-29 MED ORDER — SODIUM CHLORIDE 0.9 % IJ SOLN: 3.0000 mL | INTRAMUSCULAR | Status: DC | PRN

## 2013-05-29 MED ORDER — NITROGLYCERIN 0.4 MG SL SUBL: 0.4000 mg | SUBLINGUAL_TABLET | SUBLINGUAL | Status: DC | PRN

## 2013-05-29 MED ORDER — ONDANSETRON HCL 4 MG/2ML IJ SOLN
4.0000 mg | Freq: Four times a day (QID) | INTRAMUSCULAR | Status: DC | PRN
  Administered 2013-05-30 – 2013-05-31 (×2): 4 mg via INTRAVENOUS
  Filled 2013-05-29 (×2): qty 2

## 2013-05-29 MED ORDER — ENOXAPARIN SODIUM 40 MG/0.4ML ~~LOC~~ SOLN: 40.0000 mg | SUBCUTANEOUS | Status: DC

## 2013-05-29 MED ORDER — AMIODARONE HCL 200 MG PO TABS
200.0000 mg | ORAL_TABLET | Freq: Every day | ORAL | Status: DC
  Administered 2013-05-29 – 2013-06-01 (×4): 200 mg via ORAL
  Filled 2013-05-29 (×4): qty 1

## 2013-05-29 MED ORDER — FUROSEMIDE 10 MG/ML IJ SOLN
20.0000 mg | Freq: Two times a day (BID) | INTRAMUSCULAR | Status: DC
  Administered 2013-05-30 – 2013-06-01 (×5): 20 mg via INTRAVENOUS
  Filled 2013-05-29 (×5): qty 2

## 2013-05-29 MED ORDER — TRAMADOL HCL 50 MG PO TABS
50.0000 mg | ORAL_TABLET | Freq: Four times a day (QID) | ORAL | Status: DC | PRN
  Administered 2013-05-29 – 2013-05-30 (×4): 50 mg via ORAL
  Filled 2013-05-29 (×4): qty 1

## 2013-05-29 MED ORDER — DILTIAZEM HCL ER COATED BEADS 180 MG PO CP24
360.0000 mg | ORAL_CAPSULE | Freq: Every day | ORAL | Status: DC
  Administered 2013-05-30 – 2013-06-01 (×3): 360 mg via ORAL
  Filled 2013-05-29 (×4): qty 2

## 2013-05-29 MED ORDER — SODIUM CHLORIDE 0.9 % IJ SOLN
3.0000 mL | Freq: Two times a day (BID) | INTRAMUSCULAR | Status: DC
  Administered 2013-05-30 – 2013-05-31 (×4): 3 mL via INTRAVENOUS

## 2013-05-29 MED ORDER — ACETAMINOPHEN 325 MG PO TABS
650.0000 mg | ORAL_TABLET | ORAL | Status: DC | PRN
  Administered 2013-05-30: 650 mg via ORAL
  Filled 2013-05-29: qty 2

## 2013-05-29 MED ORDER — ISOSORBIDE MONONITRATE ER 60 MG PO TB24
30.0000 mg | ORAL_TABLET | Freq: Every day | ORAL | Status: DC
  Administered 2013-05-30 – 2013-06-01 (×3): 30 mg via ORAL
  Filled 2013-05-29 (×4): qty 1

## 2013-05-29 MED ORDER — LEVOTHYROXINE SODIUM 25 MCG PO TABS
12.5000 ug | ORAL_TABLET | ORAL | Status: DC
  Administered 2013-05-30 – 2013-06-01 (×2): 12.5 ug via ORAL
  Filled 2013-05-29 (×2): qty 1

## 2013-05-29 MED ORDER — ARTIFICIAL TEARS OP OINT
TOPICAL_OINTMENT | Freq: Every day | OPHTHALMIC | Status: DC
  Administered 2013-05-30 – 2013-05-31 (×2): via OPHTHALMIC
  Filled 2013-05-29: qty 3.5

## 2013-05-29 NOTE — Progress Notes (Addendum)
ANTICOAGULATION CONSULT NOTE - Initial Consult  Pharmacy Consult for Warfarin Indication: atrial fibrillation  Allergies  Allergen Reactions  . Morphine And Related Nausea Only  . Codeine Nausea Only    GI UPSET   Patient Measurements: Height: 5\' 6"  (167.6 cm) Weight: 114 lb 10.2 oz (52 kg) IBW/kg (Calculated) : 59.3  Vital Signs: Temp: 97.6 F (36.4 C) (09/23 1745) Temp src: Oral (09/23 1745) BP: 131/51 mmHg (09/23 1745) Pulse Rate: 59 (09/23 1745)  Labs:  Recent Labs  05/29/13 1835  HGB 11.3*  HCT 35.1*  PLT 156  LABPROT 27.9*  INR 2.72*  CREATININE 1.40*   Estimated Creatinine Clearance: 20.6 ml/min (by C-G formula based on Cr of 1.4).  Medical History: Past Medical History  Diagnosis Date  . Atrial fibrillation     paroxysmal  . OA (osteoarthritis)   . Gastritis   . B12 deficiency   . Anemia   . PUD (peptic ulcer disease)   . ASCVD (arteriosclerotic cardiovascular disease)   . Spinal stenosis of lumbosacral region   . Hypothyroid   . Tachycardia-bradycardia     s/p PPM ([facility])  . CVA (cerebrovascular accident)   . Ovarian mass     benign     Medications:  Facility-administered medications prior to admission  Medication Dose Route Frequency Provider Last Rate Last Dose  . cyanocobalamin ((VITAMIN B-12)) injection 1,000 mcg  1,000 mcg Intramuscular Q30 days Ernestina Penna, MD   1,000 mcg at 04/30/13 1525   Prescriptions prior to admission  Medication Sig Dispense Refill  . acetaminophen (TYLENOL) 500 MG tablet Take 500 mg by mouth every 6 (six) hours as needed for pain.       Marland Kitchen amiodarone (PACERONE) 200 MG tablet Take 1 tablet (200 mg total) by mouth daily.  30 tablet  9  . artificial tears (LACRILUBE) OINT ophthalmic ointment Apply to left eye at bedtime.      . Cholecalciferol (VITAMIN D) 2000 UNITS tablet Take 2,000 Units by mouth daily.       Marland Kitchen diltiazem (TIAZAC) 360 MG 24 hr capsule Take 360 mg by mouth daily.      . furosemide (LASIX) 20  MG tablet Take 20 mg by mouth 2 (two) times daily. Morning and 4pm      . IRON PO Take 1 tablet by mouth daily.       Marland Kitchen levothyroxine (SYNTHROID, LEVOTHROID) 137 MCG tablet Take 137 mcg by mouth daily.      Marland Kitchen levothyroxine (SYNTHROID, LEVOTHROID) 25 MCG tablet Take 12.5 mcg by mouth daily. Patient takes on Monday,Wednesday,and Friday along with other thyroid tablet.      . meclizine (ANTIVERT) 12.5 MG tablet Take 12.5 mg by mouth as needed for dizziness.       . metoprolol (LOPRESSOR) 50 MG tablet Take 50 mg by mouth 2 (two) times daily.      . nitroGLYCERIN (NITROSTAT) 0.4 MG SL tablet Place 0.4 mg under the tongue every 5 (five) minutes as needed for chest pain.       . polyvinyl alcohol (LIQUIFILM TEARS) 1.4 % ophthalmic solution Place 1 drop into the left eye 4 (four) times daily as needed.      . promethazine (PHENERGAN) 25 MG tablet Take 12.5-25 mg by mouth every 6 (six) hours as needed for nausea.      Marland Kitchen warfarin (COUMADIN) 3 MG tablet Take 1 tablet by mouth daily.       . isosorbide mononitrate (IMDUR) 30 MG 24 hr  tablet Take 15 mg by mouth daily.      . traMADol (ULTRAM) 50 MG tablet Take 50 mg by mouth 4 (four) times daily as needed for pain.         Assessment: Okay for Protocol Patient received dose today per medication history. INR therapeutic  Goal of Therapy:  INR 2-3   Plan:  Daily PT/INR F/U am INR for further dosing.  Mady Gemma 05/29/2013,8:08 PM  Stopped Lovenox for VTE prophylaxis due to therapeutic INR. Mady Gemma, [facility]

## 2013-05-29 NOTE — Progress Notes (Signed)
Subjective:    Patient ID: [name], female    DOB: 1918-05-06, 79 y.o.   MRN: [id number]  HPIpt here today to follow up on edema and for right hip pain. Patient returns to clinic today with her son and daughter-in-law for followup of the above.   Patient Active Problem List   Diagnosis Date Noted  . B12 deficiency 05/14/2013  . Spinal stenosis of lumbar region 05/14/2013  . High risk medication use 05/14/2013  . Bell's palsy 05/10/2013  . Herpes simplex complication 05/10/2013  . Weakness of face muscles 05/09/2013  . Acute pharyngitis 05/09/2013  . Physical deconditioning 03/29/2013  . Dyspnea 06/14/2012  . A-fib 08/19/2009  . Chest pain 08/19/2009  . HYPOTHYROIDISM 02/08/2009  . ANEMIA, B12 DEFICIENCY 02/08/2009  . CAROTID SINUS SYNDROME 02/08/2009  . HYPERTENSION, UNSPECIFIED 02/08/2009  . LEFT BUNDLE BRANCH BLOCK 02/08/2009  . PAROXYSMAL ATRIAL FIBRILLATION 02/08/2009  . CVA 02/08/2009  . OSTEOARTHRITIS 02/08/2009  . SYNCOPE 02/08/2009  . CATARACT, HX OF 02/08/2009  . GASTRITIS, HX OF 02/08/2009  . MIGRAINES, HX OF 02/08/2009  . Tachycardia-bradycardia 02/08/2009   Outpatient Encounter Prescriptions as of 05/29/2013  Medication Sig Dispense Refill  . acetaminophen (TYLENOL) 500 MG tablet Take 500 mg by mouth every 6 (six) hours as needed for pain.       Marland Kitchen amiodarone (PACERONE) 200 MG tablet Take 1 tablet (200 mg total) by mouth daily.  30 tablet  9  . artificial tears (LACRILUBE) OINT ophthalmic ointment Apply to left eye at bedtime.      . Cholecalciferol (VITAMIN D) 2000 UNITS tablet Take 2,000 Units by mouth daily.        Marland Kitchen diltiazem (TIAZAC) 360 MG 24 hr capsule Take 360 mg by mouth daily.      . furosemide (LASIX) 20 MG tablet Take 20 mg by mouth 2 (two) times daily. Morning and 4pm      . HYDROcodone-acetaminophen (NORCO) 5-325 MG per tablet TAKE 1/2 TABLET TWICE DAILY AS NEEDED  30 tablet  0  . IRON PO Take 1 tablet by mouth daily.       Marland Kitchen levothyroxine  (SYNTHROID, LEVOTHROID) 137 MCG tablet Take 137 mcg by mouth daily.      Marland Kitchen levothyroxine (SYNTHROID, LEVOTHROID) 25 MCG tablet Take 12.5 mcg by mouth daily. Patient takes on Monday,Wednesday,and Friday along with other thyroid tablet.      . meclizine (ANTIVERT) 12.5 MG tablet Take 12.5 mg by mouth as needed for dizziness.       . metoprolol (LOPRESSOR) 50 MG tablet TAKE  (1)  TABLET TWICE A DAY AS DIRECTED.  60 tablet  5  . nitroGLYCERIN (NITROSTAT) 0.4 MG SL tablet Place 0.4 mg under the tongue every 5 (five) minutes as needed for chest pain.       . polyvinyl alcohol (LIQUIFILM TEARS) 1.4 % ophthalmic solution Place 1 drop into the left eye as needed.      . predniSONE (DELTASONE) 20 MG tablet Take 3 tablets (60 mg total) by mouth daily with breakfast. Start 9/6.  15 tablet  0  . promethazine (PHENERGAN) 25 MG tablet Take 1 tablet (25 mg total) by mouth every 6 (six) hours as needed. Take half tablet by mouth as needed  30 tablet  3  . ranitidine (ZANTAC 75) 75 MG tablet Take 1 tablet (75 mg total) by mouth 2 (two) times daily.      . traMADol (ULTRAM) 50 MG tablet Take 50 mg by mouth 4 (  four) times daily as needed for pain.       . valACYclovir (VALTREX) 1000 MG tablet Take 1 tablet (1,000 mg total) by mouth every 12 (twelve) hours.  12 tablet  0  . warfarin (COUMADIN) 3 MG tablet Take 1 tablet by mouth daily.       . isosorbide mononitrate (IMDUR) 30 MG 24 hr tablet Take 15 mg by mouth daily.       Facility-Administered Encounter Medications as of 05/29/2013  Medication Dose Route Frequency Provider Last Rate Last Dose  . cyanocobalamin ((VITAMIN B-12)) injection 1,000 mcg  1,000 mcg Intramuscular Q30 days Ernestina Penna, MD   1,000 mcg at 04/30/13 1525       Review of Systems  Constitutional: Negative.   HENT: Negative.   Eyes: Negative.   Respiratory: Positive for shortness of breath.   Cardiovascular: Positive for leg swelling.  Gastrointestinal: Negative.   Endocrine: Negative.    Genitourinary: Negative.   Musculoskeletal: Positive for arthralgias (right hip pain).  Skin: Positive for wound (open area on left lower leg).  Allergic/Immunologic: Negative.   Neurological: Positive for facial asymmetry.  Hematological: Negative.   Psychiatric/Behavioral: Negative.        Objective:   Physical Exam  Nursing note and vitals reviewed. Constitutional: She is oriented to person, place, and time. No distress.  Elderly white female in wheelchair  HENT:  Head: Normocephalic and atraumatic.  Right Ear: External ear normal.  Left Ear: External ear normal.  Mouth/Throat: Oropharynx is clear and moist. No oropharyngeal exudate.  Left facial droop is improving though still present No sign of infection in the left eye  Eyes: Conjunctivae are normal. Pupils are equal, round, and reactive to light. Right eye exhibits no discharge. Left eye exhibits no discharge. No scleral icterus.  Neck: Normal range of motion. Neck supple.  Cardiovascular: Normal rate and normal heart sounds.   No murmur heard. Pulmonary/Chest: Effort normal and breath sounds normal. She has no wheezes. She has no rales.  Musculoskeletal: She exhibits edema (still has pitting edema of both legs despite 5 pound weight loss with taking fluid pill regular) and tenderness (both legs are tender to palpation).  Neurological: She is alert and oriented to person, place, and time.  Skin: Skin is warm. There is erythema.  Bleeding blister left lower leg laterally with erythema  Psychiatric: Her behavior is normal. Judgment and thought content normal.  Flat affect   BP 100/56  Pulse 59  Temp(Src) 96.8 F (36 C) (Oral)  Ht 5\' 6"  (1.676 m)  Wt 116 lb (52.617 kg)  BMI 18.73 kg/m2        Assessment & Plan:   1. Edema   2. Cellulitis of left lower extremity    Lab orders canceled due to hospital admission  Patient Instructions  Spoke with the hospitalist He will admit patient to the hospital at [facility] and daughter-in-law will take her there for admission to treat edema and cellulitis   Nyra Capes MD

## 2013-05-29 NOTE — Patient Instructions (Signed)
Spoke with the hospitalist He will admit patient to the hospital at [facility] and daughter-in-law will take her there for admission to treat edema and cellulitis

## 2013-05-29 NOTE — H&P (Signed)
History and Physical  [name]:[id number] DOB: 01-09-1918 DOA: 05/29/2013  Referring physician: Dr. Christell Constant, direct admission PCP: [name], MD   Chief Complaint: Lower leg swelling  HPI:  79 year old woman presented to her primary care physician's office today with lower extremity edema. This was found to be increasing despite an increase in outpatient Lasix. Because of increasing edema, bulla and some erythema direct admission was requested for further evaluation to rule out cellulitis and to treat lower extremity edema refractory to outpatient therapy.  History obtained from patient, son, daughter-in-law and primary care physician by telephone. Patient was hospitalized approximately 3 weeks ago for left facial weakness found to be Bell's palsy and shingles. Since that time her facial weakness has improved. However she has developed increasing lower extremity edema which was attributed to prednisone. She was seen in followup initially with minimal edema 9/8. She was seen 9/16 for weeping edema and noted to be markedly edematous especially the left leg. Lasix was adjusted. She seen again in the office today and despite outpatient therapy edema was worsening the patient developed bulla on her leg.  Family reports patient has chronic bilateral lower extremity edema that waxes and wanes. It has actually been much worse than this in the past. She has a history of chronic stable angina perhaps one time per week for which she takes nitroglycerin. However 9/20 she had a severe episode and laid down most of the day. Her history is somewhat vague and is difficult to ascertain the frequency with which she has had angina. There has been some associated shortness of breath with this. She has no chest pain currently.  She has a history coronary artery disease and has been managed medically by cardiology.  Review of Systems:  Negative for fever, visual changes, sore throat, new muscle aches, dysuria,  bleeding, n/v/abdominal pain.  Past Medical History  Diagnosis Date  . Atrial fibrillation     paroxysmal  . OA (osteoarthritis)   . Gastritis   . B12 deficiency   . Anemia   . PUD (peptic ulcer disease)   . ASCVD (arteriosclerotic cardiovascular disease)   . Spinal stenosis of lumbosacral region   . Hypothyroid   . Tachycardia-bradycardia     s/p PPM (Guidant)  . CVA (cerebrovascular accident)   . Ovarian mass     benign     Past Surgical History  Procedure Laterality Date  . Lumbar disc surgery  1991  . Gastric ulcer surgery    . Total abdominal hysterectomy w/ bilateral salpingoophorectomy  10/03    ovarian cysts   . 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 [employer]     Social History:  reports that she quit smoking about 56 years ago. Her smoking use included Cigarettes. She has a 2 pack-year smoking history. She has never used smokeless tobacco. She reports that she does not drink alcohol or use illicit drugs.  Allergies  Allergen Reactions  . Codeine Nausea Only    GI UPSET    Family History  Problem Relation Age of Onset  . Heart attack Mother   . Heart attack Father   . Cancer Sister   . Cancer Brother   . Cancer Brother      Prior to Admission medications   Medication Sig Start Date End Date Taking? Authorizing Provider  acetaminophen (TYLENOL) 500 MG tablet Take 500 mg by mouth every 6 (  six) hours as needed for pain.     Historical Provider, MD  amiodarone (PACERONE) 200 MG tablet Take 1 tablet (200 mg total) by mouth daily. 12/01/12   Hillis Range, MD  artificial tears (LACRILUBE) OINT ophthalmic ointment Apply to left eye at bedtime. 05/11/13   Standley Brooking, MD  Cholecalciferol (VITAMIN D) 2000 UNITS tablet Take 2,000 Units by mouth daily.      Historical Provider, MD  diltiazem (TIAZAC) 360 MG 24 hr capsule Take 360 mg by mouth daily.     Historical Provider, MD  furosemide (LASIX) 20 MG tablet Take 20 mg by mouth 2 (two) times daily. Morning and 4pm    Historical Provider, MD  HYDROcodone-acetaminophen (NORCO) 5-325 MG per tablet TAKE 1/2 TABLET TWICE DAILY AS NEEDED 12/20/12   Ernestina Penna, MD  IRON PO Take 1 tablet by mouth daily.     Historical Provider, MD  isosorbide mononitrate (IMDUR) 30 MG 24 hr tablet Take 15 mg by mouth daily.    Historical Provider, MD  levothyroxine (SYNTHROID, LEVOTHROID) 137 MCG tablet Take 137 mcg by mouth daily.    Historical Provider, MD  levothyroxine (SYNTHROID, LEVOTHROID) 25 MCG tablet Take 12.5 mcg by mouth daily. Patient takes on Monday,Wednesday,and Friday along with other thyroid tablet.    Historical Provider, MD  meclizine (ANTIVERT) 12.5 MG tablet Take 12.5 mg by mouth as needed for dizziness.     Historical Provider, MD  metoprolol (LOPRESSOR) 50 MG tablet TAKE  (1)  TABLET TWICE A DAY AS DIRECTED. 05/14/13   Ernestina Penna, MD  nitroGLYCERIN (NITROSTAT) 0.4 MG SL tablet Place 0.4 mg under the tongue every 5 (five) minutes as needed for chest pain.     Historical Provider, MD  polyvinyl alcohol (LIQUIFILM TEARS) 1.4 % ophthalmic solution Place 1 drop into the left eye as needed. 05/11/13   Standley Brooking, MD  predniSONE (DELTASONE) 20 MG tablet Take 3 tablets (60 mg total) by mouth daily with breakfast. Start 9/6. 05/11/13   Standley Brooking, MD  promethazine (PHENERGAN) 25 MG tablet Take 1 tablet (25 mg total) by mouth every 6 (six) hours as needed. Take half tablet by mouth as needed 03/29/13   Ozella Rocks, MD  ranitidine (ZANTAC 75) 75 MG tablet Take 1 tablet (75 mg total) by mouth 2 (two) times daily. 12/18/12   Ernestina Penna, MD  traMADol (ULTRAM) 50 MG tablet Take 50 mg by mouth 4 (four) times daily as needed for pain.     Historical Provider, MD  valACYclovir (VALTREX) 1000 MG tablet Take 1 tablet (1,000 mg total) by mouth every 12 (twelve) hours. 05/11/13   Standley Brooking, MD   warfarin (COUMADIN) 3 MG tablet Take 1 tablet by mouth daily.  04/07/11   Historical Provider, MD   Physical Exam: Filed Vitals:   05/29/13 1745  BP: 131/51  Pulse: 59  Temp: 97.6 F (36.4 C)  TempSrc: Oral  Resp: 20  Height: 5\' 6"  (1.676 m)  Weight: 52 kg (114 lb 10.2 oz)    General: Appears calm and comfortable Eyes: Pupils, irises, less appear grossly unremarkable ENT: grossly normal hearing, lips  Neck: no LAD, masses or thyromegaly Cardiovascular: RRR, no m/r/g.  Respiratory: CTA bilaterally, no w/r/r. Normal respiratory effort. Abdomen: soft, ntnd Skin: There is some chronic venous stasis changes bilateral lower extremities with erythema roughly symmetric. Erythema is not completed. No warmth or tenderness. Predominantly anterior from above the ankle to below the knee.  Large bulla left anterior lower leg, dried bulla right lower extremity above the medial malleolus. Some serous sanguinous weeping from the right leg. Feet appear uninvolved. Capillary refill somewhat sluggish bilaterally. Excellent lower extremity strength and movement. Musculoskeletal: Grossly normal tone and strength all extremities. Psychiatric: grossly normal mood and affect, speech fluent and appropriate Neurologic: grossly non-focal.  Wt Readings from Last 3 Encounters:  05/29/13 52 kg (114 lb 10.2 oz)  05/29/13 52.617 kg (116 lb)  05/22/13 54.795 kg (120 lb 12.8 oz)    Labs on Admission:  Pending   Principal Problem:   Bilateral lower extremity edema Active Problems:   PAROXYSMAL ATRIAL FIBRILLATION   Chest pain   Bell's palsy   Stable angina   Assessment/Plan 1. Bilateral lower extremity edema: Superimposed on chronic edema of unclear etiology. She does have a history of coronary artery disease. Check 2-D echocardiogram. She has no pulmonary symptoms but given her recent chest discomfort the possibility of heart failure cannot be excluded at this point. IV diuresis, check 2-D echocardiogram.  Check troponin and EKG for baseline. 2. Bilateral lower extremity erythema: Suspect chronic venous stasis changes. However bulla are new. No sign of acute limb ischemia and these changes have been present more than a week. Will check ABIs to assess for arterial insufficiency. If normal would consider Unna boot or compressive dressings. No signs to suggest infection. 3. Chronic angina: Apparently had a severe episode 4 days ago. Workup as above. Continue long-acting nitrate at higher is. Aspirin. 4. History of aortic stenosis: Appears asymptomatic. 5. History of atrial fibrillation, status post pacemaker placement: Continue diltiazem, amiodarone. 6. Recent Bell's palsy: This is improving. She completed treatment with prednisone and antiviral.  Warfarin per pharmacy  Code Status: DNR/DNI  DVT prophylaxis: Lovenox Family Communication: Discussed with son and daughter-in-law at bedside Disposition Plan/Anticipated LOS: Admission. 2-3 days.  Time spent: 55 minutes  Brendia Sacks, MD  [facility] Pager [phone] 05/29/2013, 5:59 PM

## 2013-05-30 ENCOUNTER — Inpatient Hospital Stay (HOSPITAL_COMMUNITY): Payer: Medicare Other

## 2013-05-30 DIAGNOSIS — I10 Essential (primary) hypertension: Secondary | ICD-10-CM

## 2013-05-30 DIAGNOSIS — I359 Nonrheumatic aortic valve disorder, unspecified: Secondary | ICD-10-CM

## 2013-05-30 DIAGNOSIS — I4891 Unspecified atrial fibrillation: Secondary | ICD-10-CM

## 2013-05-30 DIAGNOSIS — E039 Hypothyroidism, unspecified: Secondary | ICD-10-CM

## 2013-05-30 DIAGNOSIS — I5043 Acute on chronic combined systolic (congestive) and diastolic (congestive) heart failure: Secondary | ICD-10-CM | POA: Diagnosis present

## 2013-05-30 HISTORY — DX: Acute on chronic combined systolic (congestive) and diastolic (congestive) heart failure: I50.43

## 2013-05-30 LAB — TSH: TSH: 3.188 u[IU]/mL (ref 0.350–4.500)

## 2013-05-30 LAB — BASIC METABOLIC PANEL
BUN: 28 mg/dL — ABNORMAL HIGH (ref 6–23)
CO2: 29 mEq/L (ref 19–32)
Chloride: 103 mEq/L (ref 96–112)
GFR calc Af Amer: 50 mL/min — ABNORMAL LOW (ref >90)
GFR calc non Af Amer: 43 mL/min — ABNORMAL LOW (ref >90)
Glucose, Bld: 99 mg/dL (ref 70–99)
Potassium: 3.8 mEq/L (ref 3.5–5.1)

## 2013-05-30 LAB — PROTIME-INR: INR: 2.85 — ABNORMAL HIGH (ref 0.00–1.49)

## 2013-05-30 MED ORDER — WARFARIN SODIUM 2 MG PO TABS
3.0000 mg | ORAL_TABLET | ORAL | Status: DC
  Administered 2013-05-30 – 2013-05-31 (×2): 3 mg via ORAL
  Filled 2013-05-30 (×4): qty 1

## 2013-05-30 MED ORDER — TUBERCULIN PPD 5 UNIT/0.1ML ID SOLN
5.0000 [IU] | Freq: Once | INTRADERMAL | Status: DC
  Administered 2013-05-30: 5 [IU] via INTRADERMAL
  Filled 2013-05-30: qty 0.1

## 2013-05-30 MED ORDER — WARFARIN - PHARMACIST DOSING INPATIENT
Status: DC
  Administered 2013-05-31: 16:00:00

## 2013-05-30 NOTE — Progress Notes (Signed)
UR chart review completed.  

## 2013-05-30 NOTE — Progress Notes (Signed)
[facility] PROGRESS NOTE  [name] GNF:[id number] DOB: Oct 01, 1917 DOA: 05/29/2013 PCP: [name], MD  Assessment/Plan: 1. Acute combined congestive heart failure. This is likely etiology of her lower extremity edema. Her proBNP was elevated at 3907. Echocardiogram indicates mildly decreased systolic function at 45-50% with grade 2 diastolic dysfunction. She was started on IV Lasix 20 mg twice a day. Unfortunately intake and output have not been accurately charted. I have requested that staff accurately document intake and output. May need to adjust Lasix based on further urine output. Renal function appears to be tolerating this. Keep legs elevated. ABIs have been ordered and are in process. Patient reports that she has tried compression stockings in the past and has not tolerated them 2. Lower extremity edema, likely a venous stasis dermatitis. Does not appear to be any role for antibiotics at this point. 3. Chronic angina. Continue on nitrates and aspirin. 4. History of aortic stenosis. Appears asymptomatic. 5. History of atrial fibrillation, status post pacemaker placement. Continue diltiazem, amiodarone and anticoagulation.  Code Status: DNR Family Communication: discussed with patient and family at the bedside Disposition Plan: discharge to ALF, PPD test ordered.   Consultants:  none  Procedures: Echo:- Left ventricle: Wall thickness was increased in a pattern of moderate LVH. Systolic function waslow normal.The estimated ejection fraction was 50%. Mildglobal hypokinesis. Features are consistent with a pseudonormal left ventricular filling pattern, with concomitant abnormal relaxation and increased filling pressure (grade 2 diastolic dysfunction). Doppler parameters are consistent with high ventricular filling pressure. - Ventricular septum: Septal motion showed paradox, due to underlying conduction abnormality. - Aortic valve: Moderately thickened, moderately  calcified leaflets. There was moderate stenosis. Trivial regurgitation. - Mitral valve: Calcified annulus. Moderately thickened leaflets . Mild to moderate regurgitation. - Left atrium: The atrium was mildly dilated. - Right ventricle: Pacer wire or catheter noted in right ventricle. - Right atrium: The atrium was mildly dilated. Pacer wire or catheter noted in right atrium. - Tricuspid valve: Moderate regurgitation. - Pulmonary arteries: Systolic pressure was mildly to moderately increased. PA peak pressure: 45mm Hg (S). - Pericardium, extracardiac: A trivial pericardial effusion was identified.    Antibiotics:    HPI/Subjective: Complains of pain in legs, denies any shortness of breath or chest pain  Objective: Filed Vitals:   05/30/13 1530  BP: 128/74  Pulse: 60  Temp: 98.2 F (36.8 C)  Resp: 20    Intake/Output Summary (Last 24 hours) at 05/30/13 1605 Last data filed at 05/30/13 0100  Gross per 24 hour  Intake      0 ml  Output    200 ml  Net   -200 ml   Filed Weights   05/29/13 1745 05/30/13 0447  Weight: 52 kg (114 lb 10.2 oz) 52.7 kg (116 lb 2.9 oz)    Exam:   General:  NAD  Cardiovascular: S1, S2 RRR  Respiratory: crackles at bases  Abdomen: soft, nt, nd, bs+  Musculoskeletal: bilateral erythema in lower extremities with 2+ edema   Data Reviewed: Basic Metabolic Panel:  Recent Labs Lab 05/29/13 1835 05/30/13 0524  NA 137 139  K 4.2 3.8  CL 99 103  CO2 31 29  GLUCOSE 104* 99  BUN 32* 28*  CREATININE 1.40* 1.08  CALCIUM 8.9 9.0   Liver Function Tests: No results found for this basename: AST, ALT, ALKPHOS, BILITOT, PROT, ALBUMIN,  in the last 168 hours No results found for this basename: LIPASE, AMYLASE,  in the last 168 hours No results found  for this basename: AMMONIA,  in the last 168 hours CBC:  Recent Labs Lab 05/29/13 1835  WBC 9.1  HGB 11.3*  HCT 35.1*  MCV 90.2  PLT 156   Cardiac Enzymes: No results found for  this basename: CKTOTAL, CKMB, CKMBINDEX, TROPONINI,  in the last 168 hours BNP (last 3 results)  Recent Labs  05/29/13 1835  PROBNP 3907.0*   CBG: No results found for this basename: GLUCAP,  in the last 168 hours  No results found for this or any previous visit (from the past 240 hour(s)).   Studies: No results found.  Scheduled Meds: . amiodarone  200 mg Oral Daily  . artificial tears   Left Eye QHS  . diltiazem  360 mg Oral Daily  . famotidine  20 mg Oral QHS  . furosemide  20 mg Intravenous BID  . isosorbide mononitrate  30 mg Oral Daily  . levothyroxine  12.5 mcg Oral Custom  . levothyroxine  137 mcg Oral QAC breakfast  . sodium chloride  3 mL Intravenous Q12H  . warfarin  3 mg Oral Q24H  . Warfarin - Pharmacist Dosing Inpatient   Does not apply Q24H   Continuous Infusions:   Principal Problem:   Bilateral lower extremity edema Active Problems:   PAROXYSMAL ATRIAL FIBRILLATION   Chest pain   Bell's palsy   Stable angina    Time spent:    [facility]  [facility] Pager [phone]. If 7PM-7AM, please contact night-coverage at [email or web address], password [facility] 05/30/2013, 4:05 PM  LOS: 1 day

## 2013-05-30 NOTE — Clinical Social Work Placement (Signed)
Clinical Social Work Department CLINICAL SOCIAL WORK PLACEMENT NOTE 05/30/2013  [name], [name]  Account Number:  [id number] Admit date:  05/29/2013  Clinical Social Worker:  Derenda Fennel, LCSW  Date/time:  05/30/2013 02:00 PM  Clinical Social Work is seeking post-discharge placement for this patient at the following level of care:   ASSISTED LIVING/REST HOME   (*CSW will update this form in Epic as items are completed)   05/30/2013  Patient/family provided with [facility] Department of Clinical Social Work's list of facilities offering this level of care within the geographic area requested by the patient (or if unable, by the patient's family).  05/30/2013  Patient/family informed of their freedom to choose among providers that offer the needed level of care, that participate in Medicare, Medicaid or managed care program needed by the patient, have an available bed and are willing to accept the patient.  05/30/2013  Patient/family informed of [facility]' ownership interest in [facility], as well as of the fact that they are under no obligation to receive care at this facility.  PASARR submitted to EDS on  PASARR number received from EDS on   FL2 transmitted to all facilities in geographic area requested by pt/family on  05/30/2013 FL2 transmitted to all facilities within larger geographic area on   Patient informed that his/her managed care company has contracts with or will negotiate with  certain facilities, including the following:     Patient/family informed of bed offers received:   Patient chooses bed at  Physician recommends and patient chooses bed at    Patient to be transferred to  on   Patient to be transferred to facility by   The following physician request were entered in Epic:   Additional Comments:  Derenda Fennel, LCSW [phone]

## 2013-05-30 NOTE — Progress Notes (Signed)
ANTICOAGULATION CONSULT NOTE   Pharmacy Consult for Warfarin Indication: atrial fibrillation  Allergies  Allergen Reactions  . Morphine And Related Nausea Only  . Codeine Nausea Only    GI UPSET   Patient Measurements: Height: 5\' 6"  (167.6 cm) Weight: 116 lb 2.9 oz (52.7 kg) IBW/kg (Calculated) : 59.3  Vital Signs: Temp: 97.6 F (36.4 C) (09/24 0554) Temp src: Oral (09/24 0554) BP: 124/71 mmHg (09/24 0554) Pulse Rate: 59 (09/24 0554)  Labs:  Recent Labs  05/29/13 1835 05/30/13 0524  HGB 11.3*  --   HCT 35.1*  --   PLT 156  --   LABPROT 27.9* 28.9*  INR 2.72* 2.85*  CREATININE 1.40* 1.08   Estimated Creatinine Clearance: 27.1 ml/min (by C-G formula based on Cr of 1.08).  Medical History: Past Medical History  Diagnosis Date  . Atrial fibrillation     paroxysmal  . OA (osteoarthritis)   . Gastritis   . B12 deficiency   . Anemia   . PUD (peptic ulcer disease)   . ASCVD (arteriosclerotic cardiovascular disease)   . Spinal stenosis of lumbosacral region   . Hypothyroid   . Tachycardia-bradycardia     s/p PPM (Guidant)  . CVA (cerebrovascular accident)   . Ovarian mass     benign    Medications:  Facility-administered medications prior to admission  Medication Dose Route Frequency Provider Last Rate Last Dose  . cyanocobalamin ((VITAMIN B-12)) injection 1,000 mcg  1,000 mcg Intramuscular Q30 days Ernestina Penna, MD   1,000 mcg at 04/30/13 1525   Prescriptions prior to admission  Medication Sig Dispense Refill  . acetaminophen (TYLENOL) 500 MG tablet Take 500 mg by mouth every 6 (six) hours as needed for pain.       Marland Kitchen amiodarone (PACERONE) 200 MG tablet Take 1 tablet (200 mg total) by mouth daily.  30 tablet  9  . artificial tears (LACRILUBE) OINT ophthalmic ointment Apply to left eye at bedtime.      . Cholecalciferol (VITAMIN D) 2000 UNITS tablet Take 2,000 Units by mouth daily.       Marland Kitchen diltiazem (TIAZAC) 360 MG 24 hr capsule Take 360 mg by mouth daily.       . furosemide (LASIX) 20 MG tablet Take 20 mg by mouth 2 (two) times daily. Morning and 4pm      . IRON PO Take 1 tablet by mouth daily.       Marland Kitchen levothyroxine (SYNTHROID, LEVOTHROID) 137 MCG tablet Take 137 mcg by mouth daily.      Marland Kitchen levothyroxine (SYNTHROID, LEVOTHROID) 25 MCG tablet Take 12.5 mcg by mouth daily. Patient takes on Monday,Wednesday,and Friday along with other thyroid tablet.      . meclizine (ANTIVERT) 12.5 MG tablet Take 12.5 mg by mouth as needed for dizziness.       . metoprolol (LOPRESSOR) 50 MG tablet Take 50 mg by mouth 2 (two) times daily.      . nitroGLYCERIN (NITROSTAT) 0.4 MG SL tablet Place 0.4 mg under the tongue every 5 (five) minutes as needed for chest pain.       . polyvinyl alcohol (LIQUIFILM TEARS) 1.4 % ophthalmic solution Place 1 drop into the left eye 4 (four) times daily as needed.      . promethazine (PHENERGAN) 25 MG tablet Take 12.5-25 mg by mouth every 6 (six) hours as needed for nausea.      Marland Kitchen warfarin (COUMADIN) 3 MG tablet Take 1 tablet by mouth daily.       Marland Kitchen  isosorbide mononitrate (IMDUR) 30 MG 24 hr tablet Take 15 mg by mouth daily.      . traMADol (ULTRAM) 50 MG tablet Take 50 mg by mouth 4 (four) times daily as needed for pain.        Assessment: 79yo female on chronic Coumadin PTA for h/o afib INR therapeutic.  Pt also on amiodarone which can interact with Warfarin.  Goal of Therapy:  INR 2-3   Plan:  Continue Coumadin 3mg  PO daily (home dose) INR daily, monitor CBC  Juandavid Dallman A 05/30/2013,11:11 AM  Stopped Lovenox for VTE prophylaxis due to therapeutic INR. Wayland Denis, [place]

## 2013-05-30 NOTE — Clinical Social Work Psychosocial (Signed)
Clinical Social Work Department BRIEF PSYCHOSOCIAL ASSESSMENT 05/30/2013  [name], [name]     Account Number:  [id number]     Admit date:  05/29/2013  Clinical Social Worker:  Nancie Neas  Date/Time:  05/30/2013 02:05 PM  Referred by:  RN  Date Referred:  05/30/2013 Referred for  ALF Placement   Other Referral:   Interview type:  Patient Other interview type:   son and daughter-in-law    PSYCHOSOCIAL DATA Living Status:  ALONE Admitted from facility:   Level of care:   Primary support name:  [name] Primary support relationship to patient:  CHILD, ADULT Degree of support available:   supportive    CURRENT CONCERNS Current Concerns  Post-Acute Placement   Other Concerns:    SOCIAL WORK ASSESSMENT / PLAN CSW met with pt, pt's son, [name], and pt's daughter-in-law at bedside. [name] is HCPOA per his report. Pt alert and oriented. She reports she has been living alone. [name] and his wife live next door. Pt feels she had been managing okay until the last few weeks. She generally ambulates with a walker. Pt has hired someone to come into her home 5 hours a day to assist with household chores and her care. [name] also checks on pt daily. They had begun talking about assisted living placement for pt. Her PCP Dr. Christell Constant also encourage this. Pt's great granddaughter works at [employer] and this is their preference. Family provided business card for admissions at facility. Pt is aware that this would be private pay as she states she will not qualify for Medicaid. Angie at facility confirms that family has discussed pt's situation with her and they will work with them on payment.   Assessment/plan status:  Psychosocial Support/Ongoing Assessment of Needs Other assessment/ plan:   Information/referral to community resources:   [facility]    PATIENT'S/FAMILY'S RESPONSE TO PLAN OF CARE: Pt reports she would like to go home for a day or two, but with encouragement from family  is agreeable to go to ALF from hospital. CSW requested TB skin test. Will complete FL2 and fax to [facility].       Derenda Fennel, [place] [phone]

## 2013-05-30 NOTE — Progress Notes (Signed)
*  PRELIMINARY RESULTS* Echocardiogram 2D Echocardiogram has been performed.  [name] 05/30/2013, 12:53 PM

## 2013-05-31 ENCOUNTER — Inpatient Hospital Stay (HOSPITAL_COMMUNITY): Payer: Medicare Other

## 2013-05-31 DIAGNOSIS — I509 Heart failure, unspecified: Secondary | ICD-10-CM

## 2013-05-31 DIAGNOSIS — I5043 Acute on chronic combined systolic (congestive) and diastolic (congestive) heart failure: Principal | ICD-10-CM

## 2013-05-31 LAB — BASIC METABOLIC PANEL
Calcium: 8.7 mg/dL (ref 8.4–10.5)
GFR calc non Af Amer: 42 mL/min — ABNORMAL LOW (ref >90)
Potassium: 4 mEq/L (ref 3.5–5.1)
Sodium: 138 mEq/L (ref 135–145)

## 2013-05-31 LAB — PROTIME-INR
INR: 2.71 — ABNORMAL HIGH (ref 0.00–1.49)
Prothrombin Time: 27.8 seconds — ABNORMAL HIGH (ref 11.6–15.2)

## 2013-05-31 LAB — URINALYSIS, ROUTINE W REFLEX MICROSCOPIC
Bilirubin Urine: NEGATIVE
Ketones, ur: NEGATIVE mg/dL
Nitrite: NEGATIVE
Protein, ur: NEGATIVE mg/dL
Urobilinogen, UA: 1 mg/dL (ref 0.0–1.0)

## 2013-05-31 NOTE — Care Management Note (Addendum)
    Page 1 of 1   06/01/2013     11:12:43 AM   CARE MANAGEMENT NOTE 06/01/2013  [name], [name]   Account Number:  [id number]  Date Initiated:  05/31/2013  Documentation initiated by:  Sharrie Rothman  Subjective/Objective Assessment:   Pt admitted from home with CHF and cellulitis. Pt lives alone and has a son who lives next door and is very active in the care of the pt. Pt uses a walker for home use and has private duty care 5 hours a day.     Action/Plan:   Pt is requesting ALF at [facility] at discharge. CSW is aware and will arrange discharge to facility when medically stable.   Anticipated DC Date:  06/04/2013   Anticipated DC Plan:  ASSISTED LIVING / REST HOME  In-house referral  Clinical Social Worker      DC Planning Services  CM consult      Choice offered to / List presented to:             Status of service:  Completed, signed off Medicare Important Message given?  YES (If response is "NO", the following Medicare IM given date fields will be blank) Date Medicare IM given:  06/01/2013 Date Additional Medicare IM given:    Discharge Disposition:  ASSISTED LIVING  Per UR Regulation:    If discussed at Long Length of Stay Meetings, dates discussed:    Comments:  06/01/13 1110 Arlyss Queen, RN BSN CM Pt discharged to [facility] ALF. CSW to arrange discharge to facility.  05/31/13 1152 Arlyss Queen, RN BSN CM

## 2013-05-31 NOTE — Clinical Social Work Placement (Signed)
Clinical Social Work Department CLINICAL SOCIAL WORK PLACEMENT NOTE 05/31/2013  [name], [name]  Account Number:  [id number] Admit date:  05/29/2013  Clinical Social Worker:  Derenda Fennel, LCSW  Date/time:  05/30/2013 02:00 PM  Clinical Social Work is seeking post-discharge placement for this patient at the following level of care:   ASSISTED LIVING/REST HOME   (*CSW will update this form in Epic as items are completed)   05/30/2013  Patient/family provided with [facility] Department of Clinical Social Work's list of facilities offering this level of care within the geographic area requested by the patient (or if unable, by the patient's family).  05/30/2013  Patient/family informed of their freedom to choose among providers that offer the needed level of care, that participate in Medicare, Medicaid or managed care program needed by the patient, have an available bed and are willing to accept the patient.  05/30/2013  Patient/family informed of [facility]' ownership interest in [facility], as well as of the fact that they are under no obligation to receive care at this facility.  PASARR submitted to EDS on  PASARR number received from EDS on   FL2 transmitted to all facilities in geographic area requested by pt/family on  05/30/2013 FL2 transmitted to all facilities within larger geographic area on   Patient informed that his/her managed care company has contracts with or will negotiate with  certain facilities, including the following:     Patient/family informed of bed offers received:  05/31/2013 Patient chooses bed at OTHER Physician recommends and patient chooses bed at  OTHER  Patient to be transferred to  on   Patient to be transferred to facility by   The following physician request were entered in Epic:   Additional Comments: [facility]  [place], [place] [phone]

## 2013-05-31 NOTE — Progress Notes (Signed)
[facility] PROGRESS NOTE  [name] ZOX:[id number] DOB: [birth year] DOA: 05/29/2013 PCP: [name], MD  Assessment/Plan: 1. Acute combined congestive heart failure. This is likely etiology of her lower extremity edema. Her proBNP was elevated at 3907. Echocardiogram indicates mildly decreased systolic function at 45-50% with grade 2 diastolic dysfunction. She was started on IV Lasix 20 mg twice a day. From a volume standpoint, she appears to be improving. Renal function appears to be tolerating this. Keep legs elevated. ABIs showed adequate perfusion to lower extremities. Patient reports that she has tried compression stockings in the past and has not tolerated them 2. Lower extremity edema, likely a venous stasis dermatitis. Does not appear to be any role for antibiotics at this point. Improving on current treatments, keep legs elevated. 3. Chronic angina. Continue on nitrates and aspirin. 4. History of aortic stenosis. Appears asymptomatic. 5. History of atrial fibrillation, status post pacemaker placement. Continue diltiazem, amiodarone and anticoagulation. 6. Confusion.  Unclear etiology.  May possibly be related to being in the hospital/change of environment.  Family reports that this has happened in the past when she has been hospitalized.  Will check urine and chest xray to rule out any underlying infection.  If work up is negative, she can likely discharge tomorrow to ALF.  Will hold tramadol for now, but this is a chronic medication.  Code Status: DNR Family Communication: discussed with patient and family at the bedside Disposition Plan: discharge to ALF, PPD test ordered.   Consultants:  none  Procedures: Echo:- Left ventricle: Wall thickness was increased in a pattern of moderate LVH. Systolic function waslow normal.The estimated ejection fraction was 50%. Mildglobal hypokinesis. Features are consistent with a pseudonormal left ventricular filling pattern, with  concomitant abnormal relaxation and increased filling pressure (grade 2 diastolic dysfunction). Doppler parameters are consistent with high ventricular filling pressure. - Ventricular septum: Septal motion showed paradox, due to underlying conduction abnormality. - Aortic valve: Moderately thickened, moderately calcified leaflets. There was moderate stenosis. Trivial regurgitation. - Mitral valve: Calcified annulus. Moderately thickened leaflets . Mild to moderate regurgitation. - Left atrium: The atrium was mildly dilated. - Right ventricle: Pacer wire or catheter noted in right ventricle. - Right atrium: The atrium was mildly dilated. Pacer wire or catheter noted in right atrium. - Tricuspid valve: Moderate regurgitation. - Pulmonary arteries: Systolic pressure was mildly to moderately increased. PA peak pressure: 45mm Hg (S). - Pericardium, extracardiac: A trivial pericardial effusion was identified.    Antibiotics:    HPI/Subjective: Patient has had some confusion today, does not know that she is in the hospital. Was refusing medicines earlier today  Objective: Filed Vitals:   05/31/13 1425  BP: 142/58  Pulse: 59  Temp: 97.9 F (36.6 C)  Resp: 18    Intake/Output Summary (Last 24 hours) at 05/31/13 1639 Last data filed at 05/31/13 1301  Gross per 24 hour  Intake    300 ml  Output   1000 ml  Net   -700 ml   Filed Weights   05/29/13 1745 05/30/13 0447 05/31/13 0532  Weight: 52 kg (114 lb 10.2 oz) 52.7 kg (116 lb 2.9 oz) 53.7 kg (118 lb 6.2 oz)    Exam:   General:  NAD  Cardiovascular: S1, S2 RRR  Respiratory: crackles at bases  Abdomen: soft, nt, nd, bs+  Musculoskeletal: bilateral erythema in lower extremities significantly improved, edema is 1+   Data Reviewed: Basic Metabolic Panel:  Recent Labs Lab 05/29/13 1835 05/30/13 0524 05/31/13 8119  NA 137 139 138  K 4.2 3.8 4.0  CL 99 103 100  CO2 31 29 30   GLUCOSE 104* 99 91  BUN 32* 28*  26*  CREATININE 1.40* 1.08 1.10  CALCIUM 8.9 9.0 8.7   Liver Function Tests: No results found for this basename: AST, ALT, ALKPHOS, BILITOT, PROT, ALBUMIN,  in the last 168 hours No results found for this basename: LIPASE, AMYLASE,  in the last 168 hours No results found for this basename: AMMONIA,  in the last 168 hours CBC:  Recent Labs Lab 05/29/13 1835  WBC 9.1  HGB 11.3*  HCT 35.1*  MCV 90.2  PLT 156   Cardiac Enzymes: No results found for this basename: CKTOTAL, CKMB, CKMBINDEX, TROPONINI,  in the last 168 hours BNP (last 3 results)  Recent Labs  05/29/13 1835  PROBNP 3907.0*   CBG: No results found for this basename: GLUCAP,  in the last 168 hours  No results found for this or any previous visit (from the past 240 hour(s)).   Studies: US Arterial Seg Single  2013-06-01   *RADIOLOGY REPORT*  Clinical Data: Bilateral lower extremity vascular ulcers and skin change, history of a bilateral lower extremity claudication symptoms and rest pain, prior history of angioplasty, history of hypertension, former smoker, evaluate for PAD; initial encounter.  ULTRASOUND ANKLE/BRACHIAL INDICES BILATERAL  Comparison: None.  Findings:  Right lower extremity: ABI - 1.0; biphasic wave forms demonstrated with the right dorsalis pedis artery.  Monophasic wave forms are demonstrated with the right posterior tibial artery.  Left lower extremity: ABI - 1.46; biphasic wave forms are demonstrated within the left posterior tibial artery.  Monophasic wave forms are demonstrated in the left dorsalis pedis artery.  IMPRESSION: 1.  No evidence of hemodynamically significant vasoocclusive disease within either lower extremity. 2.  Elevated ABI within the left lower extremity may suggest distal small vessel calcifications, most commonly attributable to diabetes.   Original Report Authenticated By: Tacey Ruiz, MD    Scheduled Meds: . amiodarone  200 mg Oral Daily  . artificial tears   Left Eye QHS  .  diltiazem  360 mg Oral Daily  . famotidine  20 mg Oral QHS  . furosemide  20 mg Intravenous BID  . isosorbide mononitrate  30 mg Oral Daily  . levothyroxine  12.5 mcg Oral Custom  . levothyroxine  137 mcg Oral QAC breakfast  . sodium chloride  3 mL Intravenous Q12H  . tuberculin  5 Units Intradermal Once  . warfarin  3 mg Oral Q24H  . Warfarin - Pharmacist Dosing Inpatient   Does not apply Q24H   Continuous Infusions:   Principal Problem:   Bilateral lower extremity edema Active Problems:   PAROXYSMAL ATRIAL FIBRILLATION   Chest pain   Bell's palsy   Stable angina   Acute on chronic combined systolic and diastolic CHF (congestive heart failure)    Time spent:    Joab Carden  [facility] Pager [phone]. If 7PM-7AM, please contact night-coverage at [email or web address], password [facility] 05/31/2013, 4:39 PM  LOS: 2 days

## 2013-05-31 NOTE — Progress Notes (Signed)
ANTICOAGULATION CONSULT NOTE   Pharmacy Consult for Warfarin Indication: atrial fibrillation  Allergies  Allergen Reactions  . Morphine And Related Nausea Only  . Codeine Nausea Only    GI UPSET   Patient Measurements: Height: 5\' 6"  (167.6 cm) Weight: 118 lb 6.2 oz (53.7 kg) IBW/kg (Calculated) : 59.3  Vital Signs: Temp: 97.5 F (36.4 C) (09/25 0532) Temp src: Oral (09/25 0532) BP: 124/70 mmHg (09/25 0532) Pulse Rate: 64 (09/25 0532)  Labs:  Recent Labs  05/29/13 1835 05/30/13 0524 05/31/13 0452  HGB 11.3*  --   --   HCT 35.1*  --   --   PLT 156  --   --   LABPROT 27.9* 28.9* 27.8*  INR 2.72* 2.85* 2.71*  CREATININE 1.40* 1.08 1.10   Estimated Creatinine Clearance: 27.1 ml/min (by C-G formula based on Cr of 1.1).  Medical History: Past Medical History  Diagnosis Date  . Atrial fibrillation     paroxysmal  . OA (osteoarthritis)   . Gastritis   . B12 deficiency   . Anemia   . PUD (peptic ulcer disease)   . ASCVD (arteriosclerotic cardiovascular disease)   . Spinal stenosis of lumbosacral region   . Hypothyroid   . Tachycardia-bradycardia     s/p PPM ([facility])  . CVA (cerebrovascular accident)   . Ovarian mass     benign    Medications:  Facility-administered medications prior to admission  Medication Dose Route Frequency Provider Last Rate Last Dose  . cyanocobalamin ((VITAMIN B-12)) injection 1,000 mcg  1,000 mcg Intramuscular Q30 days Ernestina Penna, MD   1,000 mcg at 04/30/13 1525   Prescriptions prior to admission  Medication Sig Dispense Refill  . acetaminophen (TYLENOL) 500 MG tablet Take 500 mg by mouth every 6 (six) hours as needed for pain.       Marland Kitchen amiodarone (PACERONE) 200 MG tablet Take 1 tablet (200 mg total) by mouth daily.  30 tablet  9  . artificial tears (LACRILUBE) OINT ophthalmic ointment Apply to left eye at bedtime.      . Cholecalciferol (VITAMIN D) 2000 UNITS tablet Take 2,000 Units by mouth daily.       Marland Kitchen diltiazem (TIAZAC) 360  MG 24 hr capsule Take 360 mg by mouth daily.      . furosemide (LASIX) 20 MG tablet Take 20 mg by mouth 2 (two) times daily. Morning and 4pm      . IRON PO Take 1 tablet by mouth daily.       Marland Kitchen levothyroxine (SYNTHROID, LEVOTHROID) 137 MCG tablet Take 137 mcg by mouth daily.      Marland Kitchen levothyroxine (SYNTHROID, LEVOTHROID) 25 MCG tablet Take 12.5 mcg by mouth daily. Patient takes on Monday,Wednesday,and Friday along with other thyroid tablet.      . meclizine (ANTIVERT) 12.5 MG tablet Take 12.5 mg by mouth as needed for dizziness.       . metoprolol (LOPRESSOR) 50 MG tablet Take 50 mg by mouth 2 (two) times daily.      . nitroGLYCERIN (NITROSTAT) 0.4 MG SL tablet Place 0.4 mg under the tongue every 5 (five) minutes as needed for chest pain.       . polyvinyl alcohol (LIQUIFILM TEARS) 1.4 % ophthalmic solution Place 1 drop into the left eye 4 (four) times daily as needed.      . promethazine (PHENERGAN) 25 MG tablet Take 12.5-25 mg by mouth every 6 (six) hours as needed for nausea.      Marland Kitchen  warfarin (COUMADIN) 3 MG tablet Take 1 tablet by mouth daily.       . isosorbide mononitrate (IMDUR) 30 MG 24 hr tablet Take 15 mg by mouth daily.      . traMADol (ULTRAM) 50 MG tablet Take 50 mg by mouth 4 (four) times daily as needed for pain.        Assessment: 79yo female on chronic Coumadin PTA for h/o afib INR therapeutic.  Pt also on amiodarone which can interact with Warfarin.  Goal of Therapy:  INR 2-3   Plan:  Continue Coumadin 3mg  PO daily (home dose) INR daily, monitor CBC  Marguarite Markov A 05/31/2013,7:58 AM

## 2013-05-31 NOTE — Clinical Social Work Note (Signed)
[facility] accepts pt. Pts son and daughter-in-law are meeting with facility to complete paperwork this afternoon in anticipation of d/c tomorrow. Pt is confused today, social worker will discus with her tomorrow if she is clear. Facility requests early d/c. MD aware.   Cameron Ali SWK Intern

## 2013-06-01 ENCOUNTER — Encounter (HOSPITAL_COMMUNITY): Payer: Self-pay | Admitting: Internal Medicine

## 2013-06-01 LAB — BASIC METABOLIC PANEL
BUN: 20 mg/dL (ref 6–23)
Calcium: 9.3 mg/dL (ref 8.4–10.5)
Creatinine, Ser: 1.05 mg/dL (ref 0.50–1.10)
GFR calc Af Amer: 51 mL/min — ABNORMAL LOW (ref >90)
GFR calc non Af Amer: 44 mL/min — ABNORMAL LOW (ref >90)
Sodium: 136 mEq/L (ref 135–145)

## 2013-06-01 LAB — PROTIME-INR: Prothrombin Time: 23.8 seconds — ABNORMAL HIGH (ref 11.6–15.2)

## 2013-06-01 MED ORDER — AMIODARONE HCL 200 MG PO TABS: 200.0000 mg | ORAL_TABLET | Freq: Every day | ORAL | Status: DC

## 2013-06-01 MED ORDER — FAMOTIDINE 20 MG PO TABS: 20.0000 mg | ORAL_TABLET | Freq: Every day | ORAL | Status: AC

## 2013-06-01 MED ORDER — FUROSEMIDE 40 MG PO TABS: 40.0000 mg | ORAL_TABLET | Freq: Every day | ORAL | Status: DC

## 2013-06-01 MED ORDER — NITROGLYCERIN 0.4 MG SL SUBL: 0.4000 mg | SUBLINGUAL_TABLET | SUBLINGUAL | Status: AC | PRN

## 2013-06-01 MED ORDER — POTASSIUM CHLORIDE ER 20 MEQ PO TBCR: 20.0000 meq | EXTENDED_RELEASE_TABLET | Freq: Every day | ORAL | Status: AC

## 2013-06-01 MED ORDER — DILTIAZEM HCL ER BEADS 360 MG PO CP24: 360.0000 mg | ORAL_CAPSULE | Freq: Every day | ORAL | Status: DC

## 2013-06-01 MED ORDER — LEVOTHYROXINE SODIUM 137 MCG PO TABS: 137.0000 ug | ORAL_TABLET | Freq: Every day | ORAL | Status: AC

## 2013-06-01 MED ORDER — MECLIZINE HCL 12.5 MG PO TABS: 12.5000 mg | ORAL_TABLET | ORAL | Status: AC | PRN

## 2013-06-01 MED ORDER — LEVOTHYROXINE SODIUM 25 MCG PO TABS: 12.5000 ug | ORAL_TABLET | Freq: Every day | ORAL | Status: AC

## 2013-06-01 MED ORDER — PROMETHAZINE HCL 25 MG PO TABS: 12.5000 mg | ORAL_TABLET | Freq: Four times a day (QID) | ORAL | Status: AC | PRN

## 2013-06-01 MED ORDER — ISOSORBIDE MONONITRATE ER 30 MG PO TB24: 30.0000 mg | ORAL_TABLET | Freq: Every day | ORAL | Status: AC

## 2013-06-01 MED ORDER — WARFARIN SODIUM 3 MG PO TABS: 1.5000 mg | ORAL_TABLET | Freq: Every day | ORAL | Status: DC

## 2013-06-01 MED ORDER — CYANOCOBALAMIN 1000 MCG/ML IJ SOLN: 1000.0000 ug | INTRAMUSCULAR | Status: AC

## 2013-06-01 NOTE — Progress Notes (Signed)
Patient's skin test to left forearm read negative.

## 2013-06-01 NOTE — Progress Notes (Signed)
Patient with orders to be discharged to assisted living facility. Discharge packet sent with family. Patient in stable condition. Patient left with family in private vehicle.

## 2013-06-01 NOTE — Discharge Summary (Signed)
Physician Discharge Summary  [name]:[id number] DOB: 27-Dec-1917 DOA: 05/29/2013  PCP: [name], MD  Admit date: 05/29/2013 Discharge date: 06/01/2013  Time spent: Greater than 30 minutes  Recommendations for Outpatient Follow-up:  1. The patient will need her INR reassessed once or twice weekly and her CBC reassessed  once or twice monthly. 2.   Discharge Diagnoses:   1. Bilateral lower extremity edema secondary to acute on chronic combined systolic and diastolic congestive heart failure and possible chronic lower extremity venous insufficiency. Her 2-D echocardiogram, the patient's ejection fraction was 50% and with grade 2 diastolic dysfunction. 2. Mild venous stasis dermatitis of both legs. 3. Chronic angina with mild chest pain.Marland Kitchen 4. Mild normocytic anemia. 5. Mild confusion, likely secondary to hospital associated sundowning. 6. History of aortic stenosis. Stable. 7. Paroxysmal atrial fibrillation, status post pacemaker placement. On chronic anticoagulation with warfarin. 8. Bell's palsy. 9. Small bilateral effusions, secondary to acute congestive heart failure (see #1).  Discharge Condition: Improved.  Diet recommendation: Heart healthy.  Filed Weights   05/30/13 0447 05/31/13 0532 06/01/13 0525  Weight: 52.7 kg (116 lb 2.9 oz) 53.7 kg (118 lb 6.2 oz) 52 kg (114 lb 10.2 oz)    History of present illness:  The patient is a 79 year old woman with a history of paroxysmal atrial fibrillation, on Coumadin, CAD, hypothyroidism, status post pacemaker, and chronic anemia, who presented to the emergency department on 05/29/2013 with a chief complaint of lower extremity swelling. On admission, she was afebrile and hemodynamically stable. She was admitted for further evaluation and management.  Hospital Course:  The patient was started on IV Lasix. Most of not all of her chronic medications were continued. For further evaluation, a number of studies were ordered. Her pro BNP  was 3907, increased from the previous hospitalization. Her INR was therapeutic at 2.72. Her chest x-ray revealed small bilateral pleural effusions. Her 2-D echocardiogram revealed moderate LVH, ejection fraction of 50% with mid global hypokinesis and grade 2 diastolic dysfunction. Ankle brachial indices were essentially within normal limits. ACE inhibitor therapy and beta blockade therapy were withheld secondary to low-normal blood pressures on diltiazem and Imdur. She developed some mild confusion during the hospitalization. Additional studies included a chest x-ray and urinalysis. The chest x-ray remained unchanged and the urinalysis was negative for infection.  Lower extremity edema completely resolved. Her confusion resolved, but was likely the consequence of mild sundowning associated with the hospitalization. She was not belligerent and was easily directed. Nevertheless, tramadol was discontinued. Her INR remained therapeutic. She remained hemodynamically stable. Her family was concerned about the patient living alone. Therefore, assisted living facility placement was pursued. She is being discharged to [facility] assisted living facility in improved and stable condition.     Procedures: 2-D echocardiogram:- Left ventricle: Wall thickness was increased in a pattern of moderate LVH. Systolic function waslow normal.The estimated ejection fraction was 50%. Mildglobal hypokinesis. Features are consistent with a pseudonormal left ventricular filling pattern, with concomitant abnormal relaxation and increased filling pressure (grade 2 diastolic dysfunction). Doppler parameters are consistent with high ventricular filling pressure. - Ventricular septum: Septal motion showed paradox, due to underlying conduction abnormality. - Aortic valve: Moderately thickened, moderately calcified leaflets. There was moderate stenosis. Trivial regurgitation. - Mitral valve: Calcified annulus. Moderately  thickened leaflets . Mild to moderate regurgitation. - Left atrium: The atrium was mildly dilated. - Right ventricle: Pacer wire or catheter noted in right ventricle. - Right atrium: The atrium was mildly dilated. Pacer wire or  catheter noted in right atrium. - Tricuspid valve: Moderate regurgitation. - Pulmonary arteries: Systolic pressure was mildly to moderately increased. PA peak pressure: 45mm Hg (S). - Pericardium, extracardiac: A trivial pericardial effusion was identified.      Consultations:  None  Discharge Exam: Filed Vitals:   06/01/13 0525  BP: 147/59  Pulse: 60  Temp: 97.7 F (36.5 C)  Resp: 20    General: Elderly 79 year old woman lying in bed, in no acute distress. She is alert. Cardiovascular: S1, S2, with a 2-3/6 harsh systolic murmur. Respiratory: Clear to auscultation bilaterally. Extremities: Pedal pulses barely palpable. No pedal edema. No pretibial edema.  Discharge Instructions      Discharge Orders   Future Appointments Provider Department Dept Phone   09/13/2013 2:30 PM Ernestina Penna, MD [facility] [phone]   Future Orders Complete By Expires   Diet - low sodium heart healthy  As directed    Discharge instructions  As directed    Comments:     The patient will need her INR checked once or twice weekly. She will need her CBC checked at least a couple times per month.   Increase activity slowly  As directed        Medication List    STOP taking these medications       IRON PO     metoprolol 50 MG tablet  Commonly known as:  LOPRESSOR     ranitidine 75 MG tablet  Commonly known as:  ZANTAC 75  Replaced by:  famotidine 20 MG tablet      TAKE these medications       acetaminophen 500 MG tablet  Commonly known as:  TYLENOL  Take 500 mg by mouth every 6 (six) hours as needed for pain.     amiodarone 200 MG tablet  Commonly known as:  PACERONE  Take 1 tablet (200 mg total) by mouth daily.      diltiazem 360 MG 24 hr capsule  Commonly known as:  TIAZAC  Take 1 capsule (360 mg total) by mouth daily.     famotidine 20 MG tablet  Commonly known as:  PEPCID  Take 1 tablet (20 mg total) by mouth at bedtime.     furosemide 40 MG tablet  Commonly known as:  LASIX  Take 1 tablet (40 mg total) by mouth daily.     hydroxypropyl methylcellulose 2.5 % ophthalmic solution  Commonly known as:  ISOPTO TEARS  Place 1 drop into the left eye 4 (four) times daily as needed.     isosorbide mononitrate 30 MG 24 hr tablet  Commonly known as:  IMDUR  Take 1 tablet (30 mg total) by mouth daily.     levothyroxine 137 MCG tablet  Commonly known as:  SYNTHROID, LEVOTHROID  Take 1 tablet (137 mcg total) by mouth daily.     levothyroxine 25 MCG tablet  Commonly known as:  SYNTHROID, LEVOTHROID  Take 0.5 tablets (12.5 mcg total) by mouth daily. Patient takes on Monday,Wednesday,and Friday along with other thyroid tablet.     meclizine 12.5 MG tablet  Commonly known as:  ANTIVERT  Take 1 tablet (12.5 mg total) by mouth as needed for dizziness.     nitroGLYCERIN 0.4 MG SL tablet  Commonly known as:  NITROSTAT  Place 1 tablet (0.4 mg total) under the tongue every 5 (five) minutes as needed for chest pain.     Potassium Chloride ER 20 MEQ Tbcr  Take 20 mEq by  mouth daily.     promethazine 25 MG tablet  Commonly known as:  PHENERGAN  Take 0.5-1 tablets (12.5-25 mg total) by mouth every 6 (six) hours as needed for nausea.     Vitamin D 2000 UNITS tablet  Take 2,000 Units by mouth daily.     warfarin 3 MG tablet  Commonly known as:  COUMADIN  Take 0.5-1 tablets (1.5-3 mg total) by mouth daily. Takes 3 mg on everyday except Wednesday when patient takes 1.5 mg.       Allergies  Allergen Reactions  . Morphine And Related Nausea Only  . Codeine Nausea Only    GI UPSET      The results of significant diagnostics from this hospitalization (including imaging, microbiology, ancillary and  laboratory) are listed below for reference.    Significant Diagnostic Studies: Dg Chest 2 View  05/23/2013   CLINICAL DATA:  Shortness of Breath.  EXAM: CHEST  2 VIEW  COMPARISON:  None.  FINDINGS: The patient has small bilateral pleural effusions with associated basilar atelectasis. There is no pneumothorax. Cardiomegaly without edema is noted.  IMPRESSION: Small bilateral pleural effusions with associated basilar atelectasis.   Electronically Signed   By: Drusilla Kanner M.D.   On: 05/23/2013 14:55   Ct Head Wo Contrast  05/09/2013   *RADIOLOGY REPORT*  Clinical Data: Left facial droop.  History of stroke.  CT HEAD WITHOUT CONTRAST  Technique:  Contiguous axial images were obtained from the base of the skull through the vertex without contrast.  Comparison: CT scan dated 04/10/2011  Findings: There is no acute intracranial hemorrhage, infarction, or mass lesion.  There is an old left occipital infarct with secondary encephalomalacia.  There is diffuse cerebral cortical atrophy with secondary ventricular dilatation, stable.  No acute osseous abnormality.  There are a few tiny white matter lucencies in the superior aspects of the basal ganglia bilaterally which are stable and consistent with chronic small vessel ischemic disease.  IMPRESSION: No acute intracranial abnormality.   Original Report Authenticated By: Francene Boyers, M.D.   US Arterial Seg Single  05/30/2013   *RADIOLOGY REPORT*  Clinical Data: Bilateral lower extremity vascular ulcers and skin change, history of a bilateral lower extremity claudication symptoms and rest pain, prior history of angioplasty, history of hypertension, former smoker, evaluate for PAD; initial encounter.  ULTRASOUND ANKLE/BRACHIAL INDICES BILATERAL  Comparison: None.  Findings:  Right lower extremity: ABI - 1.0; biphasic wave forms demonstrated with the right dorsalis pedis artery.  Monophasic wave forms are demonstrated with the right posterior tibial artery.  Left  lower extremity: ABI - 1.46; biphasic wave forms are demonstrated within the left posterior tibial artery.  Monophasic wave forms are demonstrated in the left dorsalis pedis artery.  IMPRESSION: 1.  No evidence of hemodynamically significant vasoocclusive disease within either lower extremity. 2.  Elevated ABI within the left lower extremity may suggest distal small vessel calcifications, most commonly attributable to diabetes.   Original Report Authenticated By: Tacey Ruiz, MD   Dg Chest Port 1 View  05/31/2013   CLINICAL DATA:  Congestion.  EXAM: PORTABLE CHEST - 1 VIEW  COMPARISON:  04/16/2011  FINDINGS: Sequential pacemaker enters from the left with leads in the region of the right atrium and right ventricle. Cardiomegaly.  Central pulmonary vascular prominence without frank pulmonary edema.  Blunting costophrenic angles. Small pleural effusions cannot be excluded although findings were noted previously and may represent scarring.  No segmental consolidation.  Granuloma right lung base.  IMPRESSION:  Cardiomegaly with pacemaker in place.  Central pulmonary vascular prominence without pulmonary edema.  Small pleural effusions excluded although costophrenic angle blunting noted previously and may represent scarring.  Granuloma right lung base.   Electronically Signed   By: Bridgett Larsson   On: 05/31/2013 17:10    Microbiology: No results found for this or any previous visit (from the past 240 hour(s)).   Labs: Basic Metabolic Panel:  Recent Labs Lab 05/29/13 1835 05/30/13 0524 05/31/13 0452 06/01/13 0421  NA 137 139 138 136  K 4.2 3.8 4.0 3.5  CL 99 103 100 97  CO2 31 29 30  32  GLUCOSE 104* 99 91 92  BUN 32* 28* 26* 20  CREATININE 1.40* 1.08 1.10 1.05  CALCIUM 8.9 9.0 8.7 9.3   Liver Function Tests: No results found for this basename: AST, ALT, ALKPHOS, BILITOT, PROT, ALBUMIN,  in the last 168 hours No results found for this basename: LIPASE, AMYLASE,  in the last 168 hours No results  found for this basename: AMMONIA,  in the last 168 hours CBC:  Recent Labs Lab 05/29/13 1835  WBC 9.1  HGB 11.3*  HCT 35.1*  MCV 90.2  PLT 156   Cardiac Enzymes: No results found for this basename: CKTOTAL, CKMB, CKMBINDEX, TROPONINI,  in the last 168 hours BNP: BNP (last 3 results)  Recent Labs  05/29/13 1835  PROBNP 3907.0*   CBG: No results found for this basename: GLUCAP,  in the last 168 hours     Signed:  Vincie Linn  [facility] 06/01/2013, 8:51 AM

## 2013-06-01 NOTE — Clinical Social Work Note (Signed)
Pt d/c today to [facility]. Pt is pleasantly confused so family is agreeable. Facility aware. Pt to transfer with family. CSW will fax d/c summary and FL2. Scripts in packet with out of facility DNR.  Derenda Fennel, [place] [phone]

## 2013-06-01 NOTE — Clinical Social Work Placement (Signed)
Clinical Social Work Department CLINICAL SOCIAL WORK PLACEMENT NOTE 06/01/2013  [name], [name]  Account Number:  [id number] Admit date:  05/29/2013  Clinical Social Worker:  Derenda Fennel, LCSW  Date/time:  05/30/2013 02:00 PM  Clinical Social Work is seeking post-discharge placement for this patient at the following level of care:   ASSISTED LIVING/REST HOME   (*CSW will update this form in Epic as items are completed)   05/30/2013  Patient/family provided with [facility] Department of Clinical Social Work's list of facilities offering this level of care within the geographic area requested by the patient (or if unable, by the patient's family).  05/30/2013  Patient/family informed of their freedom to choose among providers that offer the needed level of care, that participate in Medicare, Medicaid or managed care program needed by the patient, have an available bed and are willing to accept the patient.  05/30/2013  Patient/family informed of [facility]' ownership interest in [facility], as well as of the fact that they are under no obligation to receive care at this facility.  PASARR submitted to EDS on  PASARR number received from EDS on   FL2 transmitted to all facilities in geographic area requested by pt/family on  05/30/2013 FL2 transmitted to all facilities within larger geographic area on   Patient informed that his/her managed care company has contracts with or will negotiate with  certain facilities, including the following:     Patient/family informed of bed offers received:  05/31/2013 Patient chooses bed at OTHER Physician recommends and patient chooses bed at  OTHER  Patient to be transferred to OTHER on  06/01/2013 Patient to be transferred to facility by [facility]  The following physician request were entered in Epic:   Additional Comments: [facility]  [place], [place] [phone]

## 2013-06-01 NOTE — Progress Notes (Signed)
ANTICOAGULATION CONSULT NOTE   Pharmacy Consult for Warfarin Indication: atrial fibrillation  Allergies  Allergen Reactions  . Morphine And Related Nausea Only  . Codeine Nausea Only    GI UPSET   Patient Measurements: Height: 5\' 6"  (167.6 cm) Weight: 114 lb 10.2 oz (52 kg) IBW/kg (Calculated) : 59.3  Vital Signs: Temp: 97.7 F (36.5 C) (09/26 0525) Temp src: Oral (09/26 0525) BP: 147/59 mmHg (09/26 0525) Pulse Rate: 60 (09/26 0525)  Labs:  Recent Labs  05/29/13 1835 05/30/13 0524 05/31/13 0452 06/01/13 0421  HGB 11.3*  --   --   --   HCT 35.1*  --   --   --   PLT 156  --   --   --   LABPROT 27.9* 28.9* 27.8* 23.8*  INR 2.72* 2.85* 2.71* 2.21*  CREATININE 1.40* 1.08 1.10 1.05   Estimated Creatinine Clearance: 27.5 ml/min (by C-G formula based on Cr of 1.05).  Medical History: Past Medical History  Diagnosis Date  . Atrial fibrillation     paroxysmal  . OA (osteoarthritis)   . Gastritis   . B12 deficiency   . Anemia   . PUD (peptic ulcer disease)   . ASCVD (arteriosclerotic cardiovascular disease)   . Spinal stenosis of lumbosacral region   . Hypothyroid   . Tachycardia-bradycardia     s/p PPM ([facility])  . CVA (cerebrovascular accident)   . Ovarian mass     benign   . Acute on chronic combined systolic and diastolic CHF (congestive heart failure) 05/30/2013    EF 45-50%. Garde 2 diastolic dys.   Medications:  Facility-administered medications prior to admission  Medication Dose Route Frequency Provider Last Rate Last Dose  . [DISCONTINUED] cyanocobalamin ((VITAMIN B-12)) injection 1,000 mcg  1,000 mcg Intramuscular Q30 days Ernestina Penna, MD   1,000 mcg at 04/30/13 1525   Prescriptions prior to admission  Medication Sig Dispense Refill  . acetaminophen (TYLENOL) 500 MG tablet Take 500 mg by mouth every 6 (six) hours as needed for pain.       . Cholecalciferol (VITAMIN D) 2000 UNITS tablet Take 2,000 Units by mouth daily.       . hydroxypropyl  methylcellulose (ISOPTO TEARS) 2.5 % ophthalmic solution Place 1 drop into the left eye 4 (four) times daily as needed.      . [DISCONTINUED] amiodarone (PACERONE) 200 MG tablet Take 1 tablet (200 mg total) by mouth daily.  30 tablet  9  . [DISCONTINUED] diltiazem (TIAZAC) 360 MG 24 hr capsule Take 360 mg by mouth daily.      . [DISCONTINUED] furosemide (LASIX) 20 MG tablet Take 20 mg by mouth 2 (two) times daily. Morning and 4pm      . [DISCONTINUED] IRON PO Take 1 tablet by mouth daily.       . [DISCONTINUED] isosorbide mononitrate (IMDUR) 30 MG 24 hr tablet Take 30 mg by mouth daily.      . [DISCONTINUED] levothyroxine (SYNTHROID, LEVOTHROID) 137 MCG tablet Take 137 mcg by mouth daily.      . [DISCONTINUED] levothyroxine (SYNTHROID, LEVOTHROID) 25 MCG tablet Take 12.5 mcg by mouth daily. Patient takes on Monday,Wednesday,and Friday along with other thyroid tablet.      . [DISCONTINUED] meclizine (ANTIVERT) 12.5 MG tablet Take 12.5 mg by mouth as needed for dizziness.       . [DISCONTINUED] metoprolol (LOPRESSOR) 50 MG tablet Take 50 mg by mouth 2 (two) times daily.      . [DISCONTINUED] nitroGLYCERIN (NITROSTAT)  0.4 MG SL tablet Place 0.4 mg under the tongue every 5 (five) minutes as needed for chest pain.       . [DISCONTINUED] promethazine (PHENERGAN) 25 MG tablet Take 12.5-25 mg by mouth every 6 (six) hours as needed for nausea.      . [DISCONTINUED] warfarin (COUMADIN) 3 MG tablet Take 1.5-3 mg by mouth daily. Takes 3 mg on everyday except Wednesday when patient takes 1.5 mg.       Assessment: 79yo female on chronic Coumadin PTA for h/o afib INR therapeutic.  Pt also on amiodarone which can interact with Warfarin.  Goal of Therapy:  INR 2-3   Plan:  Continue Coumadin 3mg  PO daily (home dose) INR daily, monitor CBC  Dorion Petillo A 06/01/2013,10:55 AM

## 2013-06-05 DIAGNOSIS — M48061 Spinal stenosis, lumbar region without neurogenic claudication: Secondary | ICD-10-CM

## 2013-06-05 DIAGNOSIS — B0089 Other herpesviral infection: Secondary | ICD-10-CM

## 2013-06-05 DIAGNOSIS — R269 Unspecified abnormalities of gait and mobility: Secondary | ICD-10-CM

## 2013-06-05 DIAGNOSIS — G510 Bell's palsy: Secondary | ICD-10-CM

## 2013-06-12 ENCOUNTER — Encounter: Payer: Self-pay | Admitting: Internal Medicine

## 2013-07-25 ENCOUNTER — Telehealth: Payer: Self-pay | Admitting: Internal Medicine

## 2013-07-25 NOTE — Telephone Encounter (Signed)
[place] has several questions about device and when she can be checked again.Marland Kitchen She will be transferring her care to [facility] due to being close to assisted living location.

## 2013-07-26 NOTE — Telephone Encounter (Signed)
Returned call to [place]. Pt to be followed by [facility].

## 2013-08-06 ENCOUNTER — Ambulatory Visit (INDEPENDENT_AMBULATORY_CARE_PROVIDER_SITE_OTHER): Payer: Medicare Other | Admitting: Pharmacist

## 2013-08-06 DIAGNOSIS — I4891 Unspecified atrial fibrillation: Secondary | ICD-10-CM

## 2013-08-06 NOTE — Progress Notes (Signed)
Called to speak with patient's daughter in law - Allaya Abbasi.   Patient is in assisted living facility in [place], [place].  She has been taken off warfarin.

## 2013-09-13 ENCOUNTER — Encounter: Payer: Medicare Other | Admitting: Internal Medicine

## 2013-09-13 ENCOUNTER — Ambulatory Visit: Payer: Medicare Other | Admitting: Family Medicine

## 2013-09-21 ENCOUNTER — Encounter: Payer: Medicare Other | Admitting: Internal Medicine

## 2013-09-24 ENCOUNTER — Encounter (HOSPITAL_COMMUNITY): Payer: Self-pay | Admitting: Emergency Medicine

## 2013-09-24 ENCOUNTER — Observation Stay (HOSPITAL_COMMUNITY)
Admission: EM | Admit: 2013-09-24 | Discharge: 2013-09-25 | Disposition: A | Discharge status: Skilled Nursing Facility | Payer: Medicare Other | Attending: Internal Medicine | Admitting: Internal Medicine

## 2013-09-24 DIAGNOSIS — R0600 Dyspnea, unspecified: Secondary | ICD-10-CM

## 2013-09-24 DIAGNOSIS — I10 Essential (primary) hypertension: Secondary | ICD-10-CM

## 2013-09-24 DIAGNOSIS — M48061 Spinal stenosis, lumbar region without neurogenic claudication: Secondary | ICD-10-CM

## 2013-09-24 DIAGNOSIS — I208 Other forms of angina pectoris: Secondary | ICD-10-CM

## 2013-09-24 DIAGNOSIS — I509 Heart failure, unspecified: Secondary | ICD-10-CM | POA: Diagnosis present

## 2013-09-24 DIAGNOSIS — N179 Acute kidney failure, unspecified: Principal | ICD-10-CM | POA: Diagnosis present

## 2013-09-24 DIAGNOSIS — Z87898 Personal history of other specified conditions: Secondary | ICD-10-CM

## 2013-09-24 DIAGNOSIS — M1990 Unspecified osteoarthritis, unspecified site: Secondary | ICD-10-CM

## 2013-09-24 DIAGNOSIS — I495 Sick sinus syndrome: Secondary | ICD-10-CM

## 2013-09-24 DIAGNOSIS — I6350 Cerebral infarction due to unspecified occlusion or stenosis of unspecified cerebral artery: Secondary | ICD-10-CM | POA: Diagnosis present

## 2013-09-24 DIAGNOSIS — I2089 Other forms of angina pectoris: Secondary | ICD-10-CM

## 2013-09-24 DIAGNOSIS — R29810 Facial weakness: Secondary | ICD-10-CM

## 2013-09-24 DIAGNOSIS — Z8673 Personal history of transient ischemic attack (TIA), and cerebral infarction without residual deficits: Secondary | ICD-10-CM | POA: Insufficient documentation

## 2013-09-24 DIAGNOSIS — D518 Other vitamin B12 deficiency anemias: Secondary | ICD-10-CM

## 2013-09-24 DIAGNOSIS — J029 Acute pharyngitis, unspecified: Secondary | ICD-10-CM

## 2013-09-24 DIAGNOSIS — Z79899 Other long term (current) drug therapy: Secondary | ICD-10-CM

## 2013-09-24 DIAGNOSIS — Z8719 Personal history of other diseases of the digestive system: Secondary | ICD-10-CM

## 2013-09-24 DIAGNOSIS — R5381 Other malaise: Secondary | ICD-10-CM

## 2013-09-24 DIAGNOSIS — G9001 Carotid sinus syncope: Secondary | ICD-10-CM

## 2013-09-24 DIAGNOSIS — I350 Nonrheumatic aortic (valve) stenosis: Secondary | ICD-10-CM | POA: Diagnosis present

## 2013-09-24 DIAGNOSIS — Z8669 Personal history of other diseases of the nervous system and sense organs: Secondary | ICD-10-CM

## 2013-09-24 DIAGNOSIS — E538 Deficiency of other specified B group vitamins: Secondary | ICD-10-CM

## 2013-09-24 DIAGNOSIS — R55 Syncope and collapse: Secondary | ICD-10-CM

## 2013-09-24 DIAGNOSIS — G510 Bell's palsy: Secondary | ICD-10-CM

## 2013-09-24 DIAGNOSIS — I359 Nonrheumatic aortic valve disorder, unspecified: Secondary | ICD-10-CM | POA: Insufficient documentation

## 2013-09-24 DIAGNOSIS — I5043 Acute on chronic combined systolic (congestive) and diastolic (congestive) heart failure: Secondary | ICD-10-CM

## 2013-09-24 DIAGNOSIS — R600 Localized edema: Secondary | ICD-10-CM

## 2013-09-24 DIAGNOSIS — E039 Hypothyroidism, unspecified: Secondary | ICD-10-CM | POA: Diagnosis present

## 2013-09-24 DIAGNOSIS — I447 Left bundle-branch block, unspecified: Secondary | ICD-10-CM

## 2013-09-24 DIAGNOSIS — I4891 Unspecified atrial fibrillation: Secondary | ICD-10-CM | POA: Diagnosis present

## 2013-09-24 DIAGNOSIS — E860 Dehydration: Secondary | ICD-10-CM | POA: Diagnosis present

## 2013-09-24 DIAGNOSIS — B0089 Other herpesviral infection: Secondary | ICD-10-CM

## 2013-09-24 DIAGNOSIS — R079 Chest pain, unspecified: Secondary | ICD-10-CM

## 2013-09-24 LAB — CBC WITH DIFFERENTIAL/PLATELET
BASOS ABS: 0 10*3/uL (ref 0.0–0.1)
BASOS PCT: 0 % (ref 0–1)
Eosinophils Absolute: 0 10*3/uL (ref 0.0–0.7)
Eosinophils Relative: 1 % (ref 0–5)
HCT: 33.3 % — ABNORMAL LOW (ref 36.0–46.0)
Hemoglobin: 10.7 g/dL — ABNORMAL LOW (ref 12.0–15.0)
Lymphocytes Relative: 12 % (ref 12–46)
Lymphs Abs: 0.8 10*3/uL (ref 0.7–4.0)
MCH: 30.9 pg (ref 26.0–34.0)
MCHC: 32.1 g/dL (ref 30.0–36.0)
MCV: 96.2 fL (ref 78.0–100.0)
Monocytes Absolute: 0.7 10*3/uL (ref 0.1–1.0)
Monocytes Relative: 10 % (ref 3–12)
NEUTROS ABS: 5.1 10*3/uL (ref 1.7–7.7)
NEUTROS PCT: 78 % — AB (ref 43–77)
Platelets: 122 10*3/uL — ABNORMAL LOW (ref 150–400)
RBC: 3.46 MIL/uL — ABNORMAL LOW (ref 3.87–5.11)
RDW: 14.9 % (ref 11.5–15.5)
WBC: 6.6 10*3/uL (ref 4.0–10.5)

## 2013-09-24 LAB — URINALYSIS, ROUTINE W REFLEX MICROSCOPIC
Bilirubin Urine: NEGATIVE
Glucose, UA: NEGATIVE mg/dL
Hgb urine dipstick: NEGATIVE
Ketones, ur: NEGATIVE mg/dL
LEUKOCYTES UA: NEGATIVE
NITRITE: NEGATIVE
Protein, ur: NEGATIVE mg/dL
Specific Gravity, Urine: 1.02 (ref 1.005–1.030)
UROBILINOGEN UA: 0.2 mg/dL (ref 0.0–1.0)
pH: 5.5 (ref 5.0–8.0)

## 2013-09-24 LAB — BASIC METABOLIC PANEL
BUN: 74 mg/dL — AB (ref 6–23)
CO2: 31 mEq/L (ref 19–32)
CREATININE: 2.71 mg/dL — AB (ref 0.50–1.10)
Calcium: 8.5 mg/dL (ref 8.4–10.5)
Chloride: 96 mEq/L (ref 96–112)
GFR, EST AFRICAN AMERICAN: 16 mL/min — AB (ref >90)
GFR, EST NON AFRICAN AMERICAN: 14 mL/min — AB (ref >90)
Glucose, Bld: 108 mg/dL — ABNORMAL HIGH (ref 70–99)
POTASSIUM: 4 meq/L (ref 3.7–5.3)
Sodium: 141 mEq/L (ref 137–147)

## 2013-09-24 LAB — PROTIME-INR
INR: 1.11 (ref 0.00–1.49)
Prothrombin Time: 14.1 seconds (ref 11.6–15.2)

## 2013-09-24 MED ORDER — MIRTAZAPINE 15 MG PO TABS
7.5000 mg | ORAL_TABLET | Freq: Every day | ORAL | Status: DC
  Administered 2013-09-24: 7.5 mg via ORAL
  Filled 2013-09-24 (×2): qty 0.5

## 2013-09-24 MED ORDER — CYANOCOBALAMIN 1000 MCG/ML IJ SOLN
1000.0000 ug | INTRAMUSCULAR | Status: DC
  Administered 2013-09-24: 1000 ug via INTRAMUSCULAR
  Filled 2013-09-24: qty 1

## 2013-09-24 MED ORDER — FAMOTIDINE 20 MG PO TABS
20.0000 mg | ORAL_TABLET | Freq: Every day | ORAL | Status: DC
  Administered 2013-09-24: 20 mg via ORAL
  Filled 2013-09-24: qty 1

## 2013-09-24 MED ORDER — MAGNESIUM HYDROXIDE 400 MG/5ML PO SUSP: 30.0000 mL | Freq: Every day | ORAL | Status: DC | PRN

## 2013-09-24 MED ORDER — SODIUM CHLORIDE 0.9 % IV BOLUS (SEPSIS)
500.0000 mL | Freq: Once | INTRAVENOUS | Status: AC
  Administered 2013-09-24: 500 mL via INTRAVENOUS

## 2013-09-24 MED ORDER — MECLIZINE HCL 12.5 MG PO TABS: 12.5000 mg | ORAL_TABLET | Freq: Three times a day (TID) | ORAL | Status: DC | PRN

## 2013-09-24 MED ORDER — LEVOTHYROXINE SODIUM 137 MCG PO TABS
137.0000 ug | ORAL_TABLET | Freq: Every day | ORAL | Status: DC
  Administered 2013-09-25: 137 ug via ORAL
  Filled 2013-09-24 (×2): qty 1

## 2013-09-24 MED ORDER — PROMETHAZINE HCL 12.5 MG PO TABS: 12.5000 mg | ORAL_TABLET | Freq: Four times a day (QID) | ORAL | Status: DC | PRN

## 2013-09-24 MED ORDER — WARFARIN SODIUM 5 MG PO TABS
5.0000 mg | ORAL_TABLET | Freq: Once | ORAL | Status: AC
  Administered 2013-09-24: 5 mg via ORAL
  Filled 2013-09-24: qty 1

## 2013-09-24 MED ORDER — WARFARIN - PHARMACIST DOSING INPATIENT: Status: DC

## 2013-09-24 MED ORDER — ACETAMINOPHEN 650 MG RE SUPP: 650.0000 mg | Freq: Four times a day (QID) | RECTAL | Status: DC | PRN

## 2013-09-24 MED ORDER — ACETAMINOPHEN 325 MG PO TABS
650.0000 mg | ORAL_TABLET | Freq: Four times a day (QID) | ORAL | Status: DC | PRN
Start: 2013-09-24 — End: 2013-09-25

## 2013-09-24 MED ORDER — AMIODARONE HCL 200 MG PO TABS
200.0000 mg | ORAL_TABLET | Freq: Every day | ORAL | Status: DC
  Filled 2013-09-24: qty 1

## 2013-09-24 MED ORDER — VITAMIN D 1000 UNITS PO TABS
2000.0000 [IU] | ORAL_TABLET | Freq: Every day | ORAL | Status: DC
  Administered 2013-09-25: 2000 [IU] via ORAL
  Filled 2013-09-24: qty 2

## 2013-09-24 MED ORDER — SODIUM CHLORIDE 0.9 % IV SOLN: INTRAVENOUS | Status: DC

## 2013-09-24 MED ORDER — MIRTAZAPINE 7.5 MG PO TABS
7.5000 mg | ORAL_TABLET | Freq: Every day | ORAL | Status: DC
Start: 2013-09-24 — End: 2013-09-24
  Filled 2013-09-24 (×2): qty 1

## 2013-09-24 MED ORDER — LUBIPROSTONE 24 MCG PO CAPS
24.0000 ug | ORAL_CAPSULE | Freq: Every day | ORAL | Status: DC
  Administered 2013-09-25: 24 ug via ORAL
  Filled 2013-09-24 (×2): qty 1

## 2013-09-24 MED ORDER — FLEET ENEMA 7-19 GM/118ML RE ENEM: 1.0000 | ENEMA | Freq: Every day | RECTAL | Status: DC | PRN

## 2013-09-24 MED ORDER — BUPROPION HCL 75 MG PO TABS
75.0000 mg | ORAL_TABLET | Freq: Every day | ORAL | Status: DC
  Administered 2013-09-25: 75 mg via ORAL
  Filled 2013-09-24 (×2): qty 1

## 2013-09-24 MED ORDER — SODIUM CHLORIDE 0.9 % IV SOLN
INTRAVENOUS | Status: DC
  Administered 2013-09-24: 17:00:00 via INTRAVENOUS

## 2013-09-24 MED ORDER — DOCUSATE SODIUM 100 MG PO CAPS
100.0000 mg | ORAL_CAPSULE | Freq: Two times a day (BID) | ORAL | Status: DC
  Administered 2013-09-24 – 2013-09-25 (×3): 100 mg via ORAL
  Filled 2013-09-24 (×3): qty 1

## 2013-09-24 MED ORDER — ISOSORBIDE MONONITRATE ER 60 MG PO TB24
30.0000 mg | ORAL_TABLET | Freq: Every day | ORAL | Status: DC
  Administered 2013-09-25: 30 mg via ORAL
  Filled 2013-09-24: qty 1

## 2013-09-24 NOTE — ED Provider Notes (Signed)
TIME SEEN: 12:55 PM   CHIEF COMPLAINT: abnormal lab  HPI: HPI Comments: [name] is a 80 y.o. female with a history of CHF, A. fib on Coumadin status post pacemaker placement, aortic stenosis, prior stroke who presents emergency department from her nursing facility with elevated BUN and creatinine. Patient had routine labs drawn that showed her creatinine was elevated. She states she has not been eating or drinking as much recently she has had no appetite. She denies any fevers, cough, chest pain, shortness of breath, vomiting or diarrhea, abdominal pain, dysuria or hematuria, numbness, tingling or focal weakness.  [name], MD  ROS: See HPI Constitutional: no fever  Eyes: no drainage  ENT: no runny nose   Cardiovascular:  no chest pain  Resp: no SOB  GI: no vomiting GU: no dysuria Integumentary: no rash  Allergy: no hives  Musculoskeletal: no leg swelling  Neurological: no slurred speech ROS otherwise negative  PAST MEDICAL HISTORY/PAST SURGICAL HISTORY:  Past Medical History  Diagnosis Date  . Atrial fibrillation     paroxysmal  . OA (osteoarthritis)   . Gastritis   . B12 deficiency   . Anemia   . PUD (peptic ulcer disease)   . ASCVD (arteriosclerotic cardiovascular disease)   . Spinal stenosis of lumbosacral region   . Hypothyroid   . Tachycardia-bradycardia     s/p PPM ([employer])  . CVA (cerebrovascular accident)   . Ovarian mass     benign   . Acute on chronic combined systolic and diastolic CHF (congestive heart failure) 05/30/2013    EF 45-50%. Garde 2 diastolic dys.    MEDICATIONS:  Prior to Admission medications   Medication Sig Start Date End Date Taking? Authorizing Provider  acetaminophen (TYLENOL) 500 MG tablet Take 500 mg by mouth every 6 (six) hours as needed for pain.     Historical Provider, MD  amiodarone (PACERONE) 200 MG tablet Take 1 tablet (200 mg total) by mouth daily. 06/01/13   Elliot Cousin, MD  Cholecalciferol (VITAMIN D) 2000  UNITS tablet Take 2,000 Units by mouth daily.     Historical Provider, MD  diltiazem (TIAZAC) 360 MG 24 hr capsule Take 1 capsule (360 mg total) by mouth daily. 06/01/13   Elliot Cousin, MD  famotidine (PEPCID) 20 MG tablet Take 1 tablet (20 mg total) by mouth at bedtime. 06/01/13   Elliot Cousin, MD  furosemide (LASIX) 40 MG tablet Take 1 tablet (40 mg total) by mouth daily. 06/01/13   Elliot Cousin, MD  hydroxypropyl methylcellulose (ISOPTO TEARS) 2.5 % ophthalmic solution Place 1 drop into the left eye 4 (four) times daily as needed.    Historical Provider, MD  isosorbide mononitrate (IMDUR) 30 MG 24 hr tablet Take 1 tablet (30 mg total) by mouth daily. 06/01/13   Elliot Cousin, MD  levothyroxine (SYNTHROID, LEVOTHROID) 137 MCG tablet Take 1 tablet (137 mcg total) by mouth daily. 06/01/13   Elliot Cousin, MD  levothyroxine (SYNTHROID, LEVOTHROID) 25 MCG tablet Take 0.5 tablets (12.5 mcg total) by mouth daily. Patient takes on Monday,Wednesday,and Friday along with other thyroid tablet. 06/01/13   Elliot Cousin, MD  meclizine (ANTIVERT) 12.5 MG tablet Take 1 tablet (12.5 mg total) by mouth as needed for dizziness. 06/01/13   Elliot Cousin, MD  nitroGLYCERIN (NITROSTAT) 0.4 MG SL tablet Place 1 tablet (0.4 mg total) under the tongue every 5 (five) minutes as needed for chest pain. 06/01/13   Elliot Cousin, MD  potassium chloride 20 MEQ TBCR Take 20 mEq  by mouth daily. 06/01/13   Elliot Cousinenise Fisher, MD  promethazine (PHENERGAN) 25 MG tablet Take 0.5-1 tablets (12.5-25 mg total) by mouth every 6 (six) hours as needed for nausea. 06/01/13   Elliot Cousinenise Fisher, MD    ALLERGIES:  Allergies  Allergen Reactions  . Morphine And Related Nausea Only  . Codeine Nausea Only    GI UPSET    SOCIAL HISTORY:  History  Substance Use Topics  . Smoking status: Former Smoker -- 0.50 packs/day for 4 years    Types: Cigarettes    Quit date: 09/06/1956  . Smokeless tobacco: Never Used  . Alcohol Use: No    FAMILY  HISTORY: Family History  Problem Relation Age of Onset  . Heart attack Mother   . Heart attack Father   . Cancer Sister   . Cancer Brother   . Cancer Brother     EXAM: BP 119/42  Pulse 72  Temp(Src) 97.4 F (36.3 C) (Oral)  Resp 16  Ht 5\' 6"  (1.676 m)  Wt 91 lb (41.277 kg)  BMI 14.69 kg/m2  SpO2 95% CONSTITUTIONAL: Alert and oriented and responds appropriately to questions. Well-appearing; well-nourished am a thin, elderly HEAD: Normocephalic EYES: Conjunctivae clear, PERRL ENT: normal nose; no rhinorrhea; moist mucous membranes; pharynx without lesions noted NECK: Supple, no meningismus, no LAD  CARD: RRR; S1 and S2 appreciated; no murmurs, no clicks, no rubs, no gallops RESP: Normal chest excursion without splinting or tachypnea; breath sounds clear and equal bilaterally; no wheezes, no rhonchi, no rales,  ABD/GI: Normal bowel sounds; non-distended; soft, non-tender, no rebound, no guarding BACK:  The back appears normal and is non-tender to palpation, there is no CVA tenderness EXT: Normal ROM in all joints; non-tender to palpation; no edema; normal capillary refill; no cyanosis    SKIN: Normal color for age and race; warm NEURO: Moves all extremities equally PSYCH: The patient's mood and manner are appropriate. Grooming and personal hygiene are appropriate.  MEDICAL DECISION MAKING: Patient here with elevated BUN and creatinine at her nursing facility. She is hemodynamically stable and without complaint. Her exam is benign. She states she has had mild anorexia the past several days without associated symptoms. Suspect her elevated creatinine is prerenal. We'll recheck labs and urine. We'll give IV fluids.  ED PROGRESS: Patient's creatinine today is 2.71 with a BUN greater than 70. Her electrolytes are within normal limits. Patient's lower creatinine is around 1.1. Will continue IV fluids cautiously given her history of aortic stenosis and CHF. Have discussed with hospitalist  for admission for continued hydration for acute renal failure.     Layla MawKristen N Vinh Sachs, DO 09/24/13 1429

## 2013-09-24 NOTE — H&P (Signed)
[facility] History and Physical  Karen IhaWinnie S Woodlief ZOX:[id number] DOB: 09/05/1918 DOA: 09/24/2013  Referring physician:  PCP: Rudi HeapMOORE, DONALD, MD  Specialists:   Chief Complaint: abnormal labs  HPI: [name] is a 80 y.o. female with PMH of HTN, HPL, A fib on coumadin, h/o CVA, CHF with severe AS found to have abnormal labs per primary physician; patient Cr 2.7; she denies any chest pain, no SOB, no dizziness, no nausea, vomiting or diarrhea, no focal weakness; but she reports decreased appetite, poor oral intake   Review of Systems: The patient denies anorexia, fever, weight loss,, vision loss, decreased hearing, hoarseness, chest pain, syncope, dyspnea on exertion, peripheral edema, balance deficits, hemoptysis, abdominal pain, melena, hematochezia, severe indigestion/heartburn, hematuria, incontinence, genital sores, muscle weakness, suspicious skin lesions, transient blindness, difficulty walking, depression, unusual weight change, abnormal bleeding, enlarged lymph nodes, angioedema, and breast masses.    Past Medical History  Diagnosis Date  . Atrial fibrillation     paroxysmal  . OA (osteoarthritis)   . Gastritis   . B12 deficiency   . Anemia   . PUD (peptic ulcer disease)   . ASCVD (arteriosclerotic cardiovascular disease)   . Spinal stenosis of lumbosacral region   . Hypothyroid   . Tachycardia-bradycardia     s/p PPM ([employer])  . CVA (cerebrovascular accident)   . Ovarian mass     benign   . Acute on chronic combined systolic and diastolic CHF (congestive heart failure) 05/30/2013    EF 45-50%. Garde 2 diastolic dys.   Past Surgical History  Procedure Laterality Date  . Lumbar disc surgery  1991  . Gastric ulcer surgery    . Total abdominal hysterectomy w/ bilateral salpingoophorectomy  10/03    ovarian cysts   . 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  [employer]   . Back surgery    . Abdominal hysterectomy    . Insert / replace / remove pacemaker     Social History:  reports that she quit smoking about 57 years ago. Her smoking use included Cigarettes. She has a 2 pack-year smoking history. She has never used smokeless tobacco. She reports that she does not drink alcohol or use illicit drugs. ALS where does patient live--home, ALF, SNF? and with whom if at home? No;  Can patient participate in ADLs?  Allergies  Allergen Reactions  . Morphine And Related Nausea Only  . Codeine Nausea Only    GI UPSET    Family History  Problem Relation Age of Onset  . Heart attack Mother   . Heart attack Father   . Cancer Sister   . Cancer Brother   . Cancer Brother     (be sure to complete)  Prior to Admission medications   Medication Sig Start Date End Date Taking? Authorizing Provider  buPROPion (WELLBUTRIN) 75 MG tablet Take 75 mg by mouth daily.   Yes Historical Provider, MD  Cholecalciferol (VITAMIN D) 2000 UNITS tablet Take 2,000 Units by mouth daily.    Yes Historical Provider, MD  famotidine (PEPCID) 20 MG tablet Take 1 tablet (20 mg total) by mouth at bedtime. 06/01/13  Yes Elliot Cousinenise Fisher, MD  furosemide (LASIX) 20 MG tablet Take 20 mg by mouth daily at 12 noon.   Yes Historical Provider, MD  furosemide (LASIX) 40 MG tablet Take 40 mg by mouth daily. Takes in am   Yes Historical Provider, MD  HYDROcodone-acetaminophen (NORCO/VICODIN) 5-325 MG per tablet Take 1 tablet by mouth every 8 (eight) hours as needed for moderate pain.   Yes Historical Provider, MD  isosorbide mononitrate (IMDUR) 30 MG 24 hr tablet Take 1 tablet (30 mg total) by mouth daily. 06/01/13  Yes Elliot Cousin, MD  levothyroxine (SYNTHROID, LEVOTHROID) 137 MCG tablet Take 1 tablet (137 mcg total) by mouth daily. 06/01/13  Yes Elliot Cousin, MD  lubiprostone (AMITIZA) 24 MCG capsule Take 24 mcg by mouth daily.   Yes Historical Provider, MD  metolazone (ZAROXOLYN) 2.5  MG tablet Take 2.5 mg by mouth every other day.   Yes Historical Provider, MD  mirtazapine (REMERON) 15 MG tablet Take 7.5 mg by mouth at bedtime.   Yes Historical Provider, MD  potassium chloride 20 MEQ TBCR Take 20 mEq by mouth daily. 06/01/13  Yes Elliot Cousin, MD  sodium phosphate (FLEET) 7-19 GM/118ML ENEM Place 1 enema rectally daily as needed for mild constipation or severe constipation.   Yes Historical Provider, MD  acetaminophen (TYLENOL) 500 MG tablet Take 500 mg by mouth every 6 (six) hours as needed for pain.     Historical Provider, MD  amiodarone (PACERONE) 200 MG tablet Take 1 tablet (200 mg total) by mouth daily. 06/01/13   Elliot Cousin, MD  levothyroxine (SYNTHROID, LEVOTHROID) 25 MCG tablet Take 0.5 tablets (12.5 mcg total) by mouth daily. Patient takes on Monday,Wednesday,and Friday along with other thyroid tablet. 06/01/13   Elliot Cousin, MD  Magnesium Hydroxide (MILK OF MAGNESIA PO) Take 30 mLs by mouth daily as needed (constipation).    Historical Provider, MD  meclizine (ANTIVERT) 12.5 MG tablet Take 1 tablet (12.5 mg total) by mouth as needed for dizziness. 06/01/13   Elliot Cousin, MD  nitroGLYCERIN (NITROSTAT) 0.4 MG SL tablet Place 1 tablet (0.4 mg total) under the tongue every 5 (five) minutes as needed for chest pain. 06/01/13   Elliot Cousin, MD  promethazine (PHENERGAN) 25 MG tablet Take 0.5-1 tablets (12.5-25 mg total) by mouth every 6 (six) hours as needed for nausea. 06/01/13   Elliot Cousin, MD   Physical Exam: Filed Vitals:   09/24/13 1201  BP: 119/42  Pulse: 72  Temp: 97.4 F (36.3 C)  Resp: 16     General:  alert  Eyes: eom-i, perrla   ENT: no orla ulcers   Neck: supple   Cardiovascular: s1,s2 ssytolic mr; rrr  Respiratory: CTA BL  Abdomen: soft, nt, nd   Skin: no rash  Musculoskeletal: no LE edema  Psychiatric: no hallucinations   Neurologic: CN 2-12 intact   Labs on Admission:  Basic Metabolic Panel:  Recent Labs Lab  09/24/13 1211  NA 141  K 4.0  CL 96  CO2 31  GLUCOSE 108*  BUN 74*  CREATININE 2.71*  CALCIUM 8.5   Liver Function Tests: No results found for this basename: AST, ALT, ALKPHOS, BILITOT, PROT, ALBUMIN,  in the last 168 hours No results found for this basename: LIPASE, AMYLASE,  in the last 168 hours No results found for this basename: AMMONIA,  in the last 168 hours CBC:  Recent Labs Lab 09/24/13 1211  WBC 6.6  NEUTROABS 5.1  HGB 10.7*  HCT 33.3*  MCV 96.2  PLT 122*   Cardiac Enzymes: No results found for this basename: CKTOTAL, CKMB, CKMBINDEX, TROPONINI,  in the last 168 hours  BNP (last 3 results)  Recent Labs  05/29/13 1835  PROBNP 3907.0*   CBG: No results found for this basename: GLUCAP,  in the last 168 hours  Radiological Exams on Admission: No results found.  EKG: Independently reviewed. Not done   Assessment/Plan Principal Problem:   ARF (acute renal failure) Active Problems:   HYPOTHYROIDISM   PAROXYSMAL ATRIAL FIBRILLATION   CVA   CHF (congestive heart failure)   Aortic stenosis   Dehydration  80 y.o. female with PMH of HTN, HPL, A fib on coumadin, h/o CVA, CHF with severe AS found to have abnormal labs per primary physician; patient Cr 2.7;  -admitted with AKI  1. AKI on CKD II likely over diuresis with lasix/metalazone on top of poor oral intake;  -gentle IVF, due to severe AS/CHF; hold diuretics today; recheck  Renal function in AM   2. CHF with severe AS; clinically hypovolemic;  -hold diuretics 1/19; resume in AM  3. A fib on coumadin; cont coumadin per pharmacy   4.  HTN cont home regimen   D/w patient, her son, daughter in law; patient is DNR   None;  if consultant consulted, please document name and whether formally or informally consulted  Code Status: DNR (must indicate code status--if unknown or must be presumed, indicate so) Family Communication: d/w son (indicate person spoken with, if applicable, with phone number if  by telephone) Disposition Plan: NH in 24-48 houtrs (indicate anticipated LOS)  Time spent: >35 minutes   Esperanza Sheets [facility] Pager [phone]  If 7PM-7AM, please contact night-coverage [email or web address] Password [facility] 09/24/2013, 2:34 PM

## 2013-09-24 NOTE — Progress Notes (Signed)
ANTICOAGULATION CONSULT NOTE - Initial Consult  Pharmacy Consult for Coumadin Indication: atrial fibrillation  Allergies  Allergen Reactions  . Morphine And Related Nausea Only  . Codeine Nausea Only    GI UPSET    Patient Measurements: Height: 5\' 6"  (167.6 cm) Weight: 91 lb (41.277 kg) IBW/kg (Calculated) : 59.3  Vital Signs: Temp: 97.3 F (36.3 C) (01/19 1518) Temp src: Oral (01/19 1518) BP: 113/41 mmHg (01/19 1518) Pulse Rate: 63 (01/19 1518)  Labs:  Recent Labs  09/24/13 1211  HGB 10.7*  HCT 33.3*  PLT 122*  LABPROT 14.1  INR 1.11  CREATININE 2.71*    Estimated Creatinine Clearance: 8.3 ml/min (by C-G formula based on Cr of 2.71).   Medical History: Past Medical History  Diagnosis Date  . Atrial fibrillation     paroxysmal  . OA (osteoarthritis)   . Gastritis   . B12 deficiency   . Anemia   . PUD (peptic ulcer disease)   . ASCVD (arteriosclerotic cardiovascular disease)   . Spinal stenosis of lumbosacral region   . Hypothyroid   . Tachycardia-bradycardia     s/p PPM ([employer])  . CVA (cerebrovascular accident)   . Ovarian mass     benign   . Acute on chronic combined systolic and diastolic CHF (congestive heart failure) 05/30/2013    EF 45-50%. Garde 2 diastolic dys.    Medications:  Scheduled:  . amiodarone  200 mg Oral Daily  . [START ON 09/25/2013] buPROPion  75 mg Oral Daily  . [START ON 09/25/2013] cholecalciferol  2,000 Units Oral Daily  . cyanocobalamin  1,000 mcg Intramuscular Q30 days  . docusate sodium  100 mg Oral BID  . famotidine  20 mg Oral QHS  . [START ON 09/25/2013] isosorbide mononitrate  30 mg Oral Daily  . [START ON 09/25/2013] levothyroxine  137 mcg Oral QAC breakfast  . [START ON 09/25/2013] lubiprostone  24 mcg Oral QAC breakfast  . mirtazapine  7.5 mg Oral QHS    Assessment: [name] on chronic warfarin for Afib.  Her dose is 3mg  daily except 1.5mg  on Wed.  INR is at baseline on admission.  [facility], but according to nurse at facility she has been receiving this & has had dose already today.   No bleeding noted.   Goal of Therapy:  INR 2-3  Plan:  Coumadin 5mg  po x1 today Daily INR  [name], [name] 09/24/2013,3:46 PM

## 2013-09-24 NOTE — ED Notes (Signed)
Patient with elevated creatinine and BUN. No distress. No complaints

## 2013-09-25 DIAGNOSIS — I509 Heart failure, unspecified: Secondary | ICD-10-CM

## 2013-09-25 DIAGNOSIS — I5043 Acute on chronic combined systolic (congestive) and diastolic (congestive) heart failure: Secondary | ICD-10-CM

## 2013-09-25 LAB — BASIC METABOLIC PANEL
BUN: 59 mg/dL — ABNORMAL HIGH (ref 6–23)
CO2: 30 meq/L (ref 19–32)
Calcium: 7.9 mg/dL — ABNORMAL LOW (ref 8.4–10.5)
Chloride: 99 mEq/L (ref 96–112)
Creatinine, Ser: 2.11 mg/dL — ABNORMAL HIGH (ref 0.50–1.10)
GFR, EST AFRICAN AMERICAN: 22 mL/min — AB (ref >90)
GFR, EST NON AFRICAN AMERICAN: 19 mL/min — AB (ref >90)
Glucose, Bld: 85 mg/dL (ref 70–99)
Potassium: 3.5 mEq/L — ABNORMAL LOW (ref 3.7–5.3)
SODIUM: 138 meq/L (ref 137–147)

## 2013-09-25 LAB — PROTIME-INR
INR: 1.12 (ref 0.00–1.49)
PROTHROMBIN TIME: 14.2 s (ref 11.6–15.2)

## 2013-09-25 MED ORDER — WARFARIN SODIUM 5 MG PO TABS: 5.0000 mg | ORAL_TABLET | Freq: Once | ORAL | Status: DC

## 2013-09-25 MED ORDER — FUROSEMIDE 40 MG PO TABS: 40.0000 mg | ORAL_TABLET | Freq: Every day | ORAL | Status: AC

## 2013-09-25 NOTE — Discharge Summary (Signed)
Physician Discharge Summary  [name] DOB: 06/25/1918 DOA: 09/24/2013  PCP: Rudi HeapMOORE, DONALD, MD  Admit date: 09/24/2013 Discharge date: 09/25/2013  Time spent: >35 minutes  Recommendations for Outpatient Follow-up:  F/u with BMP in 2-3 days  F/u with PCP in 1 week Discharge Diagnoses:  Principal Problem:   ARF (acute renal failure) Active Problems:   HYPOTHYROIDISM   PAROXYSMAL ATRIAL FIBRILLATION   CVA   CHF (congestive heart failure)   Aortic stenosis   Dehydration   AKI (acute kidney injury)   Discharge Condition: stable   Diet recommendation: heart healthy   Filed Weights   09/24/13 1201 09/24/13 1540 09/25/13 0500  Weight: 41.277 kg (91 lb) 39.9 kg (87 lb 15.4 oz) 41.051 kg (90 lb 8 oz)    History of present illness:  Principal Problem:  ARF (acute renal failure)  Active Problems:  HYPOTHYROIDISM  PAROXYSMAL ATRIAL FIBRILLATION  CVA  CHF (congestive heart failure)  Aortic stenosis  Dehydration   80 y.o. female with PMH of HTN, HPL, A fib on coumadin, h/o CVA, CHF with severe AS found to have abnormal labs per primary physician; patient Cr 2.7;  -admitted with AKI   Hospital Course:  1. AKI on CKD II likely over diuresis with lasix/metalazone on top of poor oral intake;  -Cr improving on gentle IVF; hold diuretics till 1/22; recheck Renal function in 2-3 days   2. CHF with severe AS; clinically hypovolemic, over diuresed; lung clear, no fluid overload;  -hold diuretics till 1/22; f/u BMP, [facility] 3. A fib on coumadin; cont coumadin 4. HTN cont home regimen   D/w patient, her son, daughter in law; patient is DNR    Procedures:  None  (i.e. Studies not automatically included, echos, thoracentesis, etc; not x-rays)  Consultations:  None   Discharge Exam: Filed Vitals:   09/25/13 0514  BP: 120/75  Pulse: 60  Temp: 97.6 F (36.4 C)  Resp: 18    General: alert Cardiovascular: s1,s2 rrr Respiratory:  CTA BL  Discharge Instructions  Discharge Orders   Future Orders Complete By Expires   Diet - low sodium heart healthy  As directed    Discharge instructions  As directed    Comments:     Hold lasix until 1/22; check BMP in 2-3 days with primary care doctor   Increase activity slowly  As directed        Medication List    STOP taking these medications       metolazone 2.5 MG tablet  Commonly known as:  ZAROXOLYN      TAKE these medications       acetaminophen 500 MG tablet  Commonly known as:  TYLENOL  Take 500 mg by mouth every 6 (six) hours as needed for pain.     buPROPion 75 MG tablet  Commonly known as:  WELLBUTRIN  Take 75 mg by mouth daily.     famotidine 20 MG tablet  Commonly known as:  PEPCID  Take 1 tablet (20 mg total) by mouth at bedtime.     furosemide 40 MG tablet  Commonly known as:  LASIX  Take 1 tablet (40 mg total) by mouth daily. Takes in am  Start taking on:  09/27/2013     HYDROcodone-acetaminophen 5-325 MG per tablet  Commonly known as:  NORCO/VICODIN  Take 1 tablet by mouth every 8 (eight) hours as needed for moderate pain.     isosorbide mononitrate 30 MG 24  hr tablet  Commonly known as:  IMDUR  Take 1 tablet (30 mg total) by mouth daily.     levothyroxine 137 MCG tablet  Commonly known as:  SYNTHROID, LEVOTHROID  Take 1 tablet (137 mcg total) by mouth daily.     levothyroxine 25 MCG tablet  Commonly known as:  SYNTHROID, LEVOTHROID  Take 0.5 tablets (12.5 mcg total) by mouth daily. Patient takes on Monday,Wednesday,and Friday along with other thyroid tablet.     meclizine 12.5 MG tablet  Commonly known as:  ANTIVERT  Take 1 tablet (12.5 mg total) by mouth as needed for dizziness.     MILK OF MAGNESIA PO  Take 30 mLs by mouth daily as needed (constipation).     mirtazapine 15 MG tablet  Commonly known as:  REMERON  Take 7.5 mg by mouth at bedtime.     nitroGLYCERIN 0.4 MG SL tablet  Commonly known as:  NITROSTAT  Place 1  tablet (0.4 mg total) under the tongue every 5 (five) minutes as needed for chest pain.     Potassium Chloride ER 20 MEQ Tbcr  Take 20 mEq by mouth daily.     promethazine 25 MG tablet  Commonly known as:  PHENERGAN  Take 0.5-1 tablets (12.5-25 mg total) by mouth every 6 (six) hours as needed for nausea.     sodium phosphate 7-19 GM/118ML Enem  Place 1 enema rectally daily as needed for mild constipation or severe constipation.     Vitamin D 2000 UNITS tablet  Take 2,000 Units by mouth daily.     warfarin 3 MG tablet  Commonly known as:  COUMADIN  Take 1.5-3 mg by mouth daily. 3mg  daily except 1.5mg  on Wed       Allergies  Allergen Reactions  . Morphine And Related Nausea Only  . Codeine Nausea Only    GI UPSET       Follow-up Information   Follow up with [name], MD In 2 days.   Specialty:  Family Medicine   Contact information:   [address] [place] [address] [phone]        The results of significant diagnostics from this hospitalization (including imaging, microbiology, ancillary and laboratory) are listed below for reference.    Significant Diagnostic Studies: No results found.  Microbiology: No results found for this or any previous visit (from the past 240 hour(s)).   Labs: Basic Metabolic Panel:  Recent Labs Lab 09/24/13 1211 09/25/13 0521  NA 141 138  K 4.0 3.5*  CL 96 99  CO2 31 30  GLUCOSE 108* 85  BUN 74* 59*  CREATININE 2.71* 2.11*  CALCIUM 8.5 7.9*   Liver Function Tests: No results found for this basename: AST, ALT, ALKPHOS, BILITOT, PROT, ALBUMIN,  in the last 168 hours No results found for this basename: LIPASE, AMYLASE,  in the last 168 hours No results found for this basename: AMMONIA,  in the last 168 hours CBC:  Recent Labs Lab 09/24/13 1211  WBC 6.6  NEUTROABS 5.1  HGB 10.7*  HCT 33.3*  MCV 96.2  PLT 122*   Cardiac Enzymes: No results found for this basename: CKTOTAL, CKMB, CKMBINDEX, TROPONINI,   in the last 168 hours BNP: BNP (last 3 results)  Recent Labs  05/29/13 1835  PROBNP 3907.0*   CBG: No results found for this basename: GLUCAP,  in the last 168 hours     Signed:  Esperanza Sheets  [facility] 09/25/2013, 10:16 AM

## 2013-09-25 NOTE — Clinical Social Work Psychosocial (Signed)
Clinical Social Work Department BRIEF PSYCHOSOCIAL ASSESSMENT 09/25/2013  [name],[name]     Account Number:  [id number]     Admit date:  09/24/2013  Clinical Social Worker:  Nancie NeasSTULTZ,Dannah Ryles, LCSW  Date/Time:  09/25/2013 09:20 AM  Referred by:  CSW  Date Referred:  09/25/2013 Referred for  ALF Placement   Other Referral:   Interview type:  Family Other interview type:   [name]- son    PSYCHOSOCIAL DATA Living Status:  FACILITY Admitted from facility:  OTHER Level of care:  Assisted Living Primary support name:  [name] Primary support relationship to patient:  CHILD, ADULT Degree of support available:   supportive    CURRENT CONCERNS Current Concerns  Post-Acute Placement   Other Concerns:    SOCIAL WORK ASSESSMENT / PLAN CSW spoke with [name], [name] as pt is not oriented. Pt known to CSW from previous admission when pt was placed at [facility] at end of September. [name] reports that [place] in [place] and family visits pt regularly. Family was concerned about pt adapting to being in a facility when she was placed and he indicates that it has been a gradual adjustment for her, but things are going well. [name], pt is a limited assist with ADLs and ambulates with a walker. She requires a wheelchair for longer distances. Pt was receiving home health RN through [facility]. Okay for return. Possible d/c today or tomorrow.   Assessment/plan status:  Psychosocial Support/Ongoing Assessment of Needs Other assessment/ plan:   Information/referral to community resources:   [facility]    PATIENT'Golden/FAMILY'Golden RESPONSE TO PLAN OF CARE: Pt unable to discuss plan of care at this time due to confusion. Family report positive feelings regarding return to [facility] when medically stable. CSW will continue to follow.       [name], [place] [phone]

## 2013-09-25 NOTE — Progress Notes (Signed)
INITIAL NUTRITION ASSESSMENT  DOCUMENTATION CODES Per approved criteria  -Underweight   INTERVENTION: Pt is being discharged back to [facility]. Add Ensure complete BID daily to increase nutrition intake.  NUTRITION DIAGNOSIS: Inadequate oral intake related to decreased po's as evidenced by severe unplanned wt loss of 29#,24% x 4 months, BMI=14.   Goal: Meet nutrition needs as able given pt advanced age and code status  Reason for Assessment: Malnutrition Screen Score =  3  80 y.o. female  Admitting Dx: ARF (acute renal failure)  ASSESSMENT: Chart reviewed. Pt discharged before opportunity to interview. Expect pt is severely malnourished based on her medical hx.  Height: Ht Readings from Last 1 Encounters:  09/24/13 5\' 6"  (1.676 m)    Weight: Wt Readings from Last 1 Encounters:  09/25/13 90 lb 8 oz (41.051 kg)    Ideal Body Weight: 130# (59 kg)  % Ideal Body Weight: 70%  Wt Readings from Last 10 Encounters:  09/25/13 90 lb 8 oz (41.051 kg)  06/01/13 114 lb 10.2 oz (52 kg)  05/29/13 116 lb (52.617 kg)  05/22/13 120 lb 12.8 oz (54.795 kg)  05/14/13 120 lb (54.432 kg)  05/11/13 120 lb 5.9 oz (54.6 kg)  05/09/13 120 lb (54.432 kg)  03/29/13 120 lb (54.432 kg)  03/01/13 126 lb 9.6 oz (57.425 kg)  01/10/13 127 lb 9.6 oz (57.879 kg)    Usual Body Weight: 120-125#  % Usual Body Weight: 76%  BMI:  Body mass index is 14.61 kg/(m^2).underweight  Estimated Nutritional Needs: Kcal: 1610-9604 Protein: 41-50 gr Fluid: 1200-1400 ml daily  Skin: skin tears (arm, back, lower leg)  Diet Order: Cardiac  EDUCATION NEEDS: -Education not appropriate at this time   Intake/Output Summary (Last 24 hours) at 09/25/13 1150 Last data filed at 09/25/13 0543  Gross per 24 hour  Intake 894.17 ml  Output    500 ml  Net 394.17 ml    Last BM:  09/23/13  Labs:   Recent Labs Lab 09/24/13 1211 09/25/13 0521  NA 141 138  K 4.0 3.5*  CL 96 99  CO2 31 30  BUN 74*  59*  CREATININE 2.71* 2.11*  CALCIUM 8.5 7.9*  GLUCOSE 108* 85    CBG (last 3)  No results found for this basename: GLUCAP,  in the last 72 hours  Scheduled Meds: . buPROPion  75 mg Oral Daily  . cholecalciferol  2,000 Units Oral Daily  . cyanocobalamin  1,000 mcg Intramuscular Q30 days  . docusate sodium  100 mg Oral BID  . famotidine  20 mg Oral QHS  . isosorbide mononitrate  30 mg Oral Daily  . levothyroxine  137 mcg Oral QAC breakfast  . lubiprostone  24 mcg Oral QAC breakfast  . mirtazapine  7.5 mg Oral QHS  . warfarin  5 mg Oral Once  . Warfarin - Pharmacist Dosing Inpatient   Does not apply Q24H    Continuous Infusions: . sodium chloride 50 mL/hr at 09/24/13 1638    Past Medical History  Diagnosis Date  . Atrial fibrillation     paroxysmal  . OA (osteoarthritis)   . Gastritis   . B12 deficiency   . Anemia   . PUD (peptic ulcer disease)   . ASCVD (arteriosclerotic cardiovascular disease)   . Spinal stenosis of lumbosacral region   . Hypothyroid   . Tachycardia-bradycardia     s/p PPM (Guidant)  . CVA (cerebrovascular accident)   . Ovarian mass     benign   .  Acute on chronic combined systolic and diastolic CHF (congestive heart failure) 05/30/2013    EF 45-50%. Garde 2 diastolic dys.    Past Surgical History  Procedure Laterality Date  . Lumbar disc surgery  1991  . Gastric ulcer surgery    . Total abdominal hysterectomy w/ bilateral salpingoophorectomy  10/03    ovarian cysts   . 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 [employer]   . Back surgery    . Abdominal hysterectomy    . Insert / replace / remove pacemaker      Royann ShiversLynn Arham Symmonds MS,RD,CSG,LDN Office: [phone] Pager: [phone]

## 2013-09-25 NOTE — Progress Notes (Signed)
ANTICOAGULATION CONSULT NOTE   Pharmacy Consult for Coumadin Indication: atrial fibrillation  Allergies  Allergen Reactions  . Morphine And Related Nausea Only  . Codeine Nausea Only    GI UPSET   Patient Measurements: Height: 5\' 6"  (167.6 cm) Weight: 90 lb 8 oz (41.051 kg) IBW/kg (Calculated) : 59.3  Vital Signs: Temp: 97.6 F (36.4 C) (01/20 0514) Temp src: Oral (01/20 0514) BP: 120/75 mmHg (01/20 0514) Pulse Rate: 60 (01/20 0514)  Labs:  Recent Labs  09/24/13 1211 09/25/13 0521  HGB 10.7*  --   HCT 33.3*  --   PLT 122*  --   LABPROT 14.1 14.2  INR 1.11 1.12  CREATININE 2.71* 2.11*   Estimated Creatinine Clearance: 10.6 ml/min (by C-G formula based on Cr of 2.11).  Medical History: Past Medical History  Diagnosis Date  . Atrial fibrillation     paroxysmal  . OA (osteoarthritis)   . Gastritis   . B12 deficiency   . Anemia   . PUD (peptic ulcer disease)   . ASCVD (arteriosclerotic cardiovascular disease)   . Spinal stenosis of lumbosacral region   . Hypothyroid   . Tachycardia-bradycardia     s/p PPM ([employer]ant)  . CVA (cerebrovascular accident)   . Ovarian mass     benign   . Acute on chronic combined systolic and diastolic CHF (congestive heart failure) 05/30/2013    EF 45-50%. Garde 2 diastolic dys.   Medications:  Scheduled:  . buPROPion  75 mg Oral Daily  . cholecalciferol  2,000 Units Oral Daily  . cyanocobalamin  1,000 mcg Intramuscular Q30 days  . docusate sodium  100 mg Oral BID  . famotidine  20 mg Oral QHS  . isosorbide mononitrate  30 mg Oral Daily  . levothyroxine  137 mcg Oral QAC breakfast  . lubiprostone  24 mcg Oral QAC breakfast  . mirtazapine  7.5 mg Oral QHS  . Warfarin - Pharmacist Dosing Inpatient   Does not apply Q24H   Assessment: [name] on chronic warfarin for Afib.  Her dose is 3mg  daily except 1.5mg  on Wed.  INR is at baseline on admission.  [facility], but according to nurse at  facility she has been receiving this.  No bleeding noted.   Goal of Therapy:  INR 2-3  Plan:  Coumadin 5mg  po x1 today Daily INR  Margo AyeHall, Dathan Attia A 09/25/2013,10:16 AM

## 2013-09-25 NOTE — Clinical Social Work Note (Signed)
[facility]. Pt, family, and facility aware and agreeable. Family to provide transport. Facility agreeable to no FL2 due to <24 hour observation. Out of facility DNR in packet.   Derenda FennelKara Reianna Batdorf, [place] [phone]

## 2013-09-25 NOTE — Progress Notes (Signed)
IV removed. Discharge instructions reviewed with patients power of attorney. Patient ready for discharge to [facility]

## 2013-09-27 ENCOUNTER — Ambulatory Visit: Payer: Medicare Other | Admitting: Family Medicine

## 2013-12-05 DEATH — deceased

## 2014-06-18 IMAGING — CR DG CHEST 2V
2 series · 2 of 2 positions shown · non-contrast
Comparison: None.

CLINICAL DATA: Shortness of Breath.

EXAM:
CHEST  2 VIEW

[view not recorded (1 of 2)]
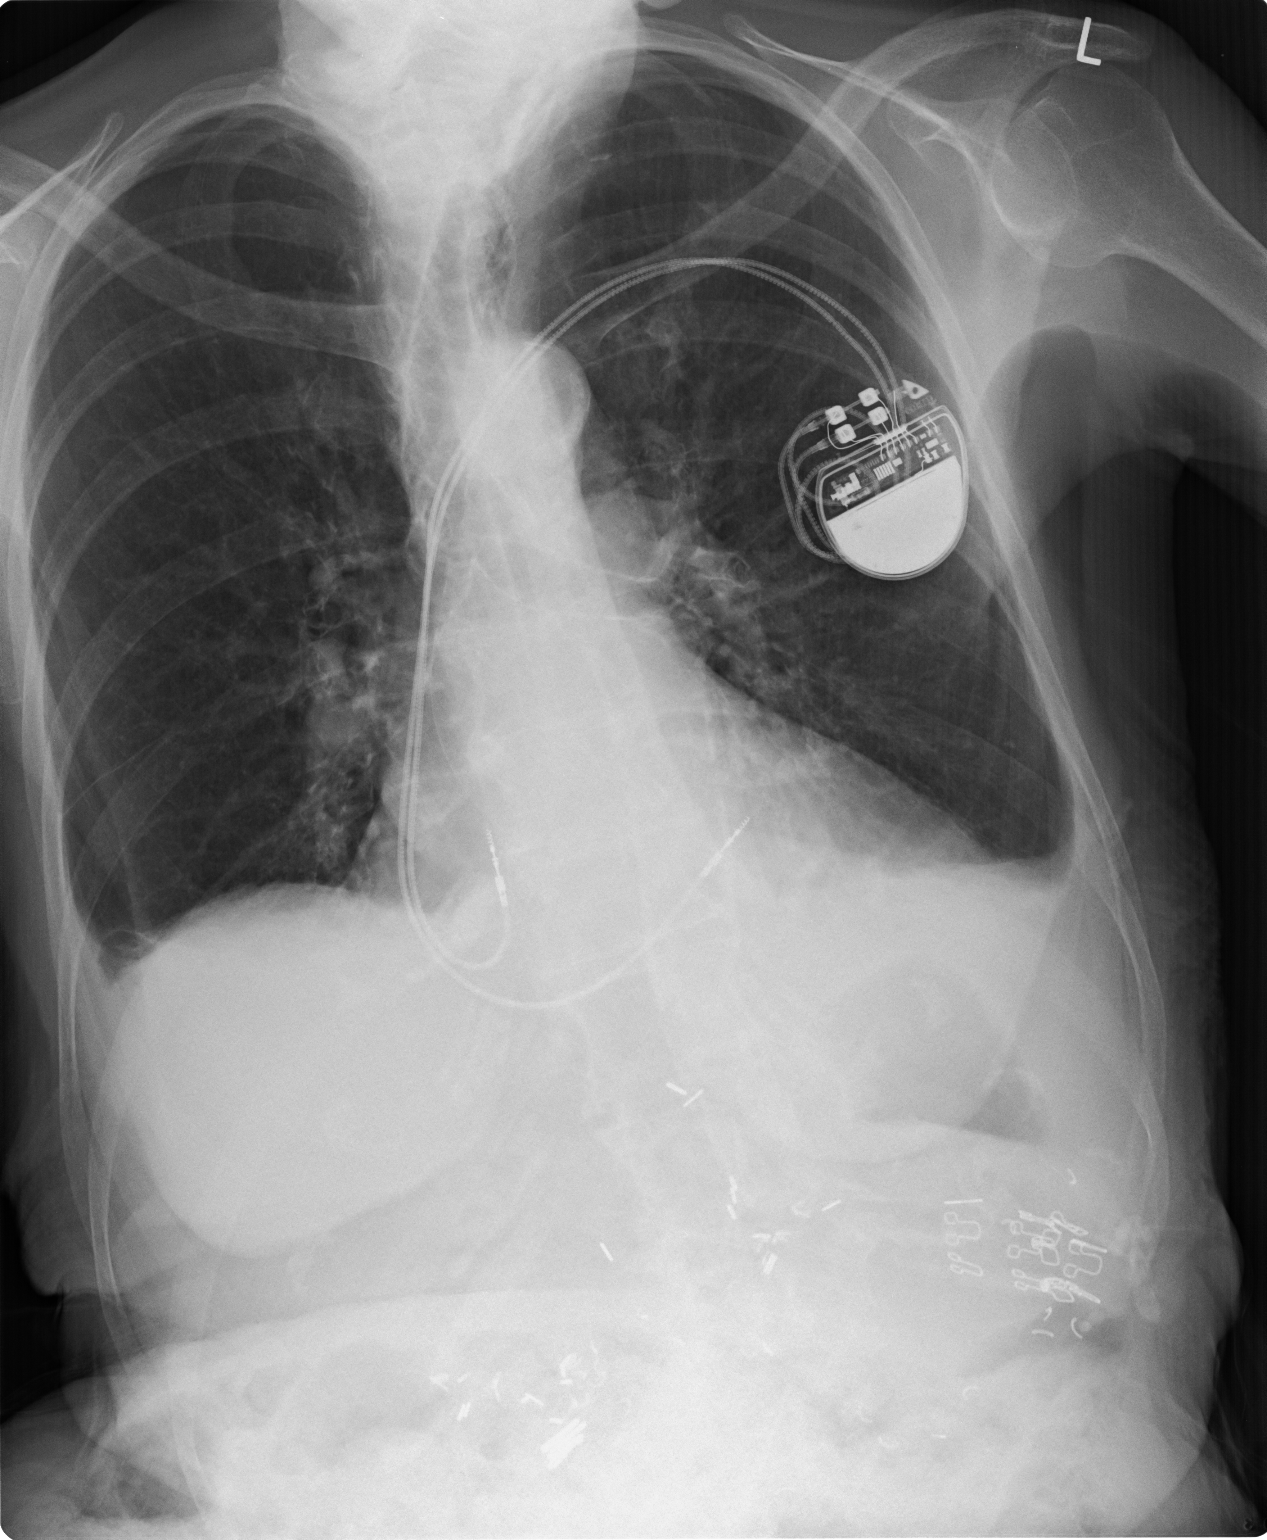

[view not recorded (2 of 2)]
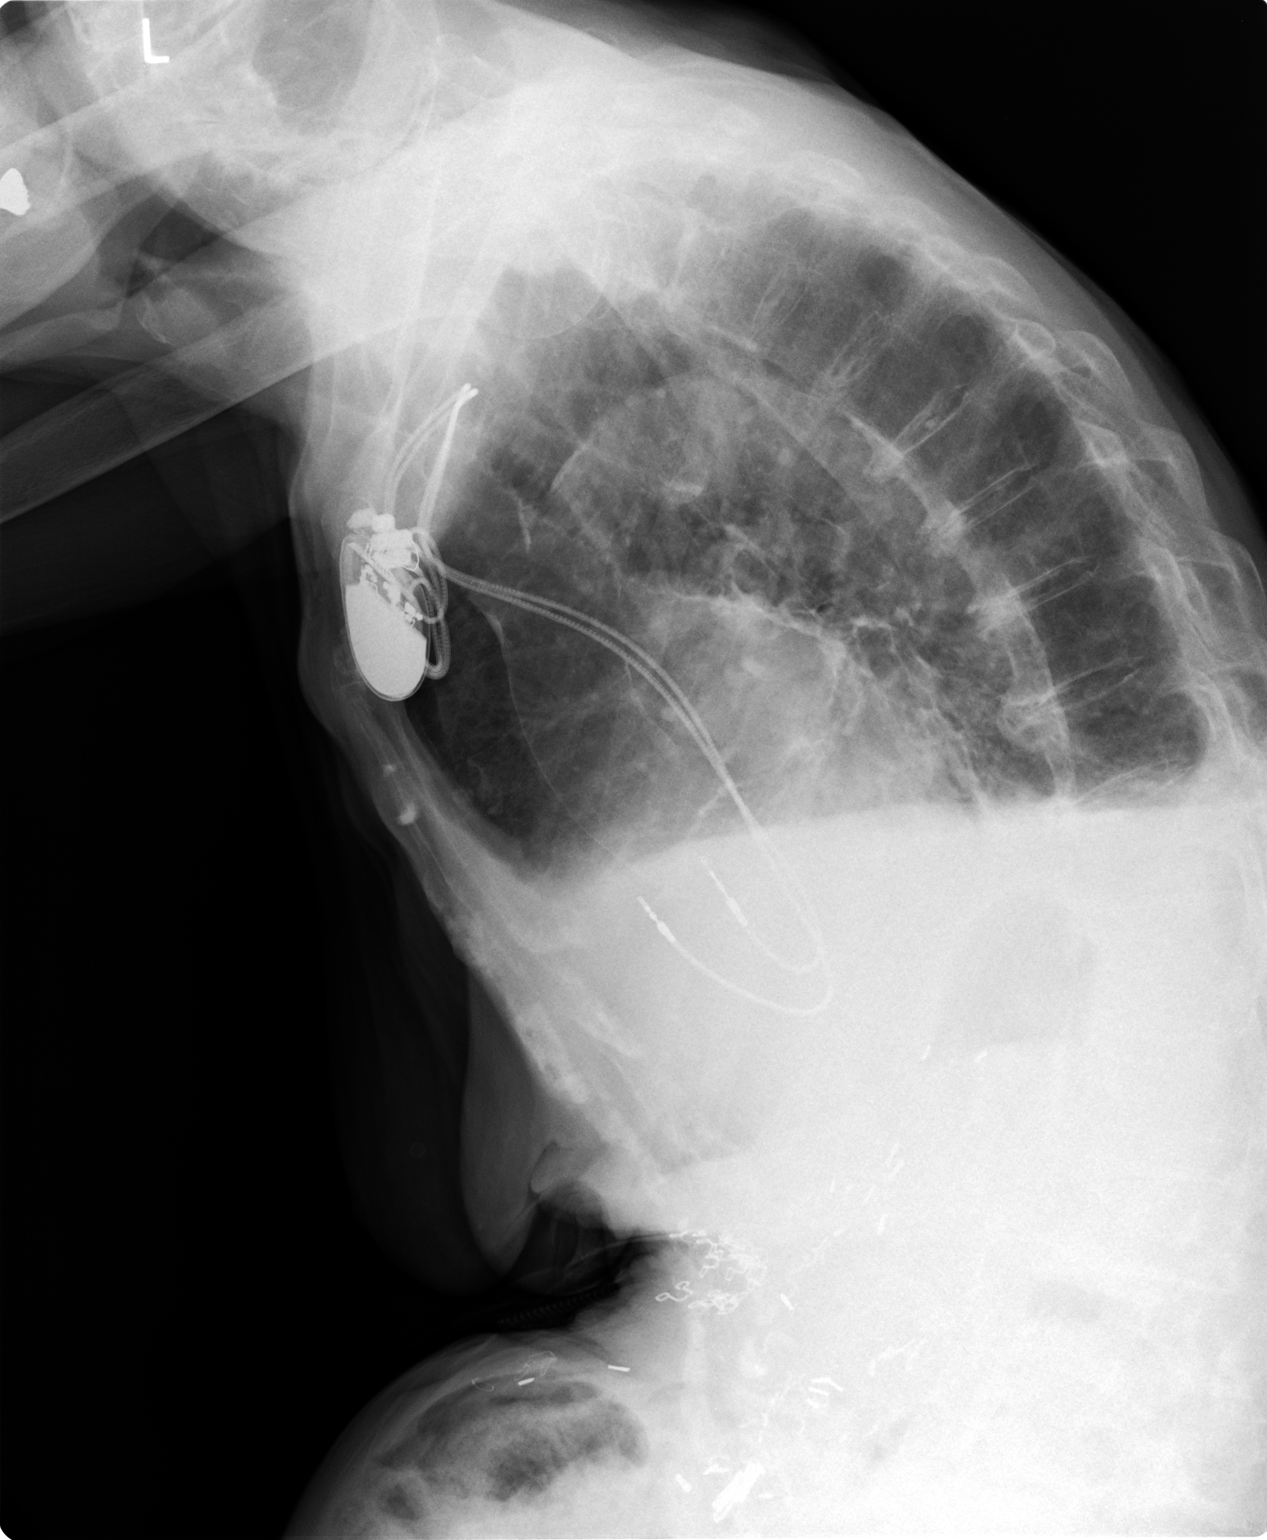

[2 of 2 positions shown; findings below may reference images not displayed]

FINDINGS: The patient has small bilateral pleural effusions with associated
basilar atelectasis. There is no pneumothorax. Cardiomegaly without
edema is noted.
IMPRESSION: Small bilateral pleural effusions with associated basilar
atelectasis.

## 2014-06-27 IMAGING — CR DG CHEST 1V PORT
1 series · 1 of 1 positions shown · non-contrast
Comparison: 04/16/2011

CLINICAL DATA: Congestion.

EXAM:
PORTABLE CHEST - 1 VIEW

[portable]
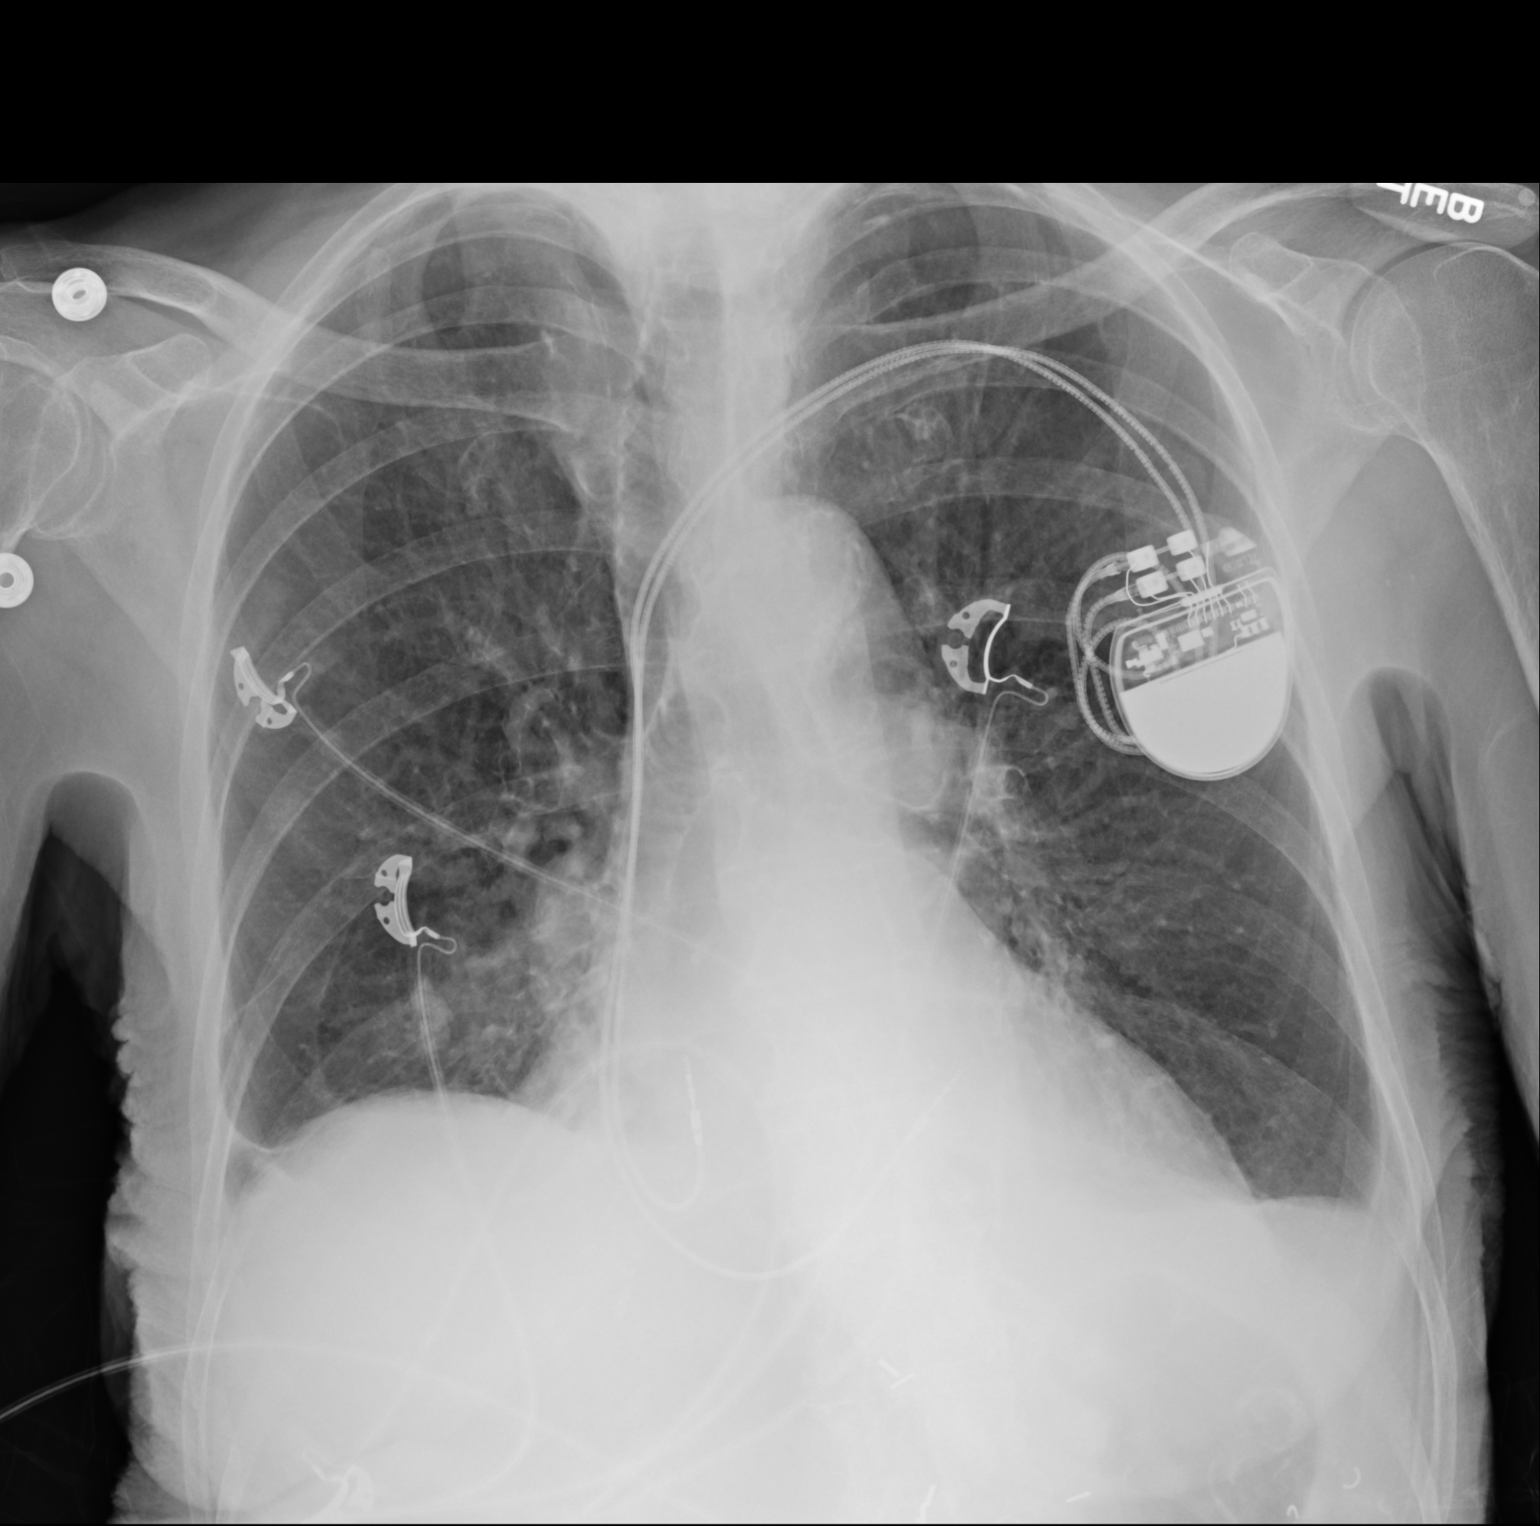

[1 of 1 positions shown; findings below may reference images not displayed]

FINDINGS: Sequential pacemaker enters from the left with leads in the region
of the right atrium and right ventricle. Cardiomegaly.

Central pulmonary vascular prominence without frank pulmonary edema.

Blunting costophrenic angles. Small pleural effusions cannot be
excluded although findings were noted previously and may represent
scarring.

No segmental consolidation.

Granuloma right lung base.
IMPRESSION: Cardiomegaly with pacemaker in place.

Central pulmonary vascular prominence without pulmonary edema.

Small pleural effusions excluded although costophrenic angle
blunting noted previously and may represent scarring.

Granuloma right lung base.
